# Patient Record
Sex: Female | Born: 1970
Health system: Southern US, Community
[De-identification: ages and names within clinical notes are randomized; demographics above are authoritative.]

## PROBLEM LIST (undated history)

## (undated) DIAGNOSIS — N939 Abnormal uterine and vaginal bleeding, unspecified: Secondary | ICD-10-CM

## (undated) DIAGNOSIS — M1990 Unspecified osteoarthritis, unspecified site: Secondary | ICD-10-CM

## (undated) DIAGNOSIS — N840 Polyp of corpus uteri: Secondary | ICD-10-CM

## (undated) DIAGNOSIS — K219 Gastro-esophageal reflux disease without esophagitis: Secondary | ICD-10-CM

## (undated) DIAGNOSIS — E785 Hyperlipidemia, unspecified: Secondary | ICD-10-CM

## (undated) DIAGNOSIS — F32A Depression, unspecified: Secondary | ICD-10-CM

## (undated) DIAGNOSIS — Z973 Presence of spectacles and contact lenses: Secondary | ICD-10-CM

## (undated) DIAGNOSIS — I10 Essential (primary) hypertension: Secondary | ICD-10-CM

## (undated) DIAGNOSIS — F419 Anxiety disorder, unspecified: Secondary | ICD-10-CM

## (undated) DIAGNOSIS — F329 Major depressive disorder, single episode, unspecified: Secondary | ICD-10-CM

## (undated) HISTORY — PX: CARPAL TUNNEL RELEASE: SHX101

## (undated) HISTORY — DX: Depression, unspecified: F32.A

## (undated) HISTORY — DX: Major depressive disorder, single episode, unspecified: F32.9

---

## 1998-03-13 ENCOUNTER — Other Ambulatory Visit: Admission: RE | Admit: 1998-03-13 | Discharge: 1998-03-13 | Payer: Self-pay | Admitting: *Deleted

## 1999-08-26 ENCOUNTER — Other Ambulatory Visit: Admission: RE | Admit: 1999-08-26 | Discharge: 1999-08-26 | Payer: Self-pay | Admitting: *Deleted

## 2000-09-10 ENCOUNTER — Other Ambulatory Visit: Admission: RE | Admit: 2000-09-10 | Discharge: 2000-09-10 | Payer: Self-pay | Admitting: *Deleted

## 2001-07-19 ENCOUNTER — Other Ambulatory Visit: Admission: RE | Admit: 2001-07-19 | Discharge: 2001-07-19 | Payer: Self-pay | Admitting: Obstetrics & Gynecology

## 2002-01-13 ENCOUNTER — Encounter: Admission: RE | Admit: 2002-01-13 | Discharge: 2002-01-13 | Payer: Self-pay | Admitting: Obstetrics and Gynecology

## 2002-03-07 ENCOUNTER — Inpatient Hospital Stay (HOSPITAL_COMMUNITY): Admission: AD | Admit: 2002-03-07 | Discharge: 2002-03-09 | Payer: Self-pay | Admitting: Obstetrics & Gynecology

## 2002-04-24 ENCOUNTER — Other Ambulatory Visit: Admission: RE | Admit: 2002-04-24 | Discharge: 2002-04-24 | Payer: Self-pay | Admitting: Obstetrics & Gynecology

## 2003-04-02 ENCOUNTER — Other Ambulatory Visit: Admission: RE | Admit: 2003-04-02 | Discharge: 2003-04-02 | Payer: Self-pay | Admitting: *Deleted

## 2004-02-18 HISTORY — PX: KNEE ARTHROSCOPY WITH ANTERIOR CRUCIATE LIGAMENT (ACL) REPAIR: SHX5644

## 2004-02-18 HISTORY — PX: KNEE ARTHROSCOPY W/ ACL RECONSTRUCTION: SHX1858

## 2004-04-14 ENCOUNTER — Other Ambulatory Visit: Admission: RE | Admit: 2004-04-14 | Discharge: 2004-04-14 | Payer: Self-pay | Admitting: *Deleted

## 2005-01-30 ENCOUNTER — Ambulatory Visit: Payer: Self-pay | Admitting: Family Medicine

## 2005-09-17 ENCOUNTER — Ambulatory Visit: Payer: Self-pay | Admitting: Family Medicine

## 2006-10-21 ENCOUNTER — Ambulatory Visit: Payer: Self-pay | Admitting: Family Medicine

## 2006-10-28 HISTORY — PX: CARPAL TUNNEL RELEASE: SHX101

## 2007-02-02 ENCOUNTER — Encounter: Payer: Self-pay | Admitting: Family Medicine

## 2007-02-07 ENCOUNTER — Ambulatory Visit: Payer: Self-pay | Admitting: Family Medicine

## 2007-02-14 LAB — CONVERTED CEMR LAB
Bilirubin, Direct: 0.1 mg/dL (ref 0.0–0.3)
Cholesterol: 214 mg/dL (ref 0–200)
Direct LDL: 144.2 mg/dL
GFR calc Af Amer: 104 mL/min
GFR calc non Af Amer: 86 mL/min
Glucose, Bld: 88 mg/dL (ref 70–99)
Potassium: 4 meq/L (ref 3.5–5.1)
Sodium: 140 meq/L (ref 135–145)
Total CHOL/HDL Ratio: 4.3
Triglycerides: 184 mg/dL — ABNORMAL HIGH (ref 0–149)

## 2007-08-02 ENCOUNTER — Other Ambulatory Visit: Admission: RE | Admit: 2007-08-02 | Discharge: 2007-08-02 | Payer: Self-pay | Admitting: Obstetrics & Gynecology

## 2007-08-09 ENCOUNTER — Ambulatory Visit: Payer: Self-pay | Admitting: Family Medicine

## 2007-08-19 LAB — CONVERTED CEMR LAB
ALT: 18 U/L
AST: 17 U/L
Albumin: 4.3 g/dL
Alkaline Phosphatase: 44 U/L
Bilirubin, Direct: 0.1 mg/dL
Cholesterol: 199 mg/dL
HDL: 51.7 mg/dL
LDL Cholesterol: 135 mg/dL — ABNORMAL HIGH
Total Bilirubin: 0.8 mg/dL
Total CHOL/HDL Ratio: 3.8
Total Protein: 7 g/dL
Triglycerides: 62 mg/dL
VLDL: 12 mg/dL

## 2008-04-16 ENCOUNTER — Ambulatory Visit: Payer: Self-pay | Admitting: Family Medicine

## 2008-04-16 DIAGNOSIS — F329 Major depressive disorder, single episode, unspecified: Secondary | ICD-10-CM

## 2008-08-07 ENCOUNTER — Other Ambulatory Visit: Admission: RE | Admit: 2008-08-07 | Discharge: 2008-08-07 | Payer: Self-pay | Admitting: Obstetrics and Gynecology

## 2010-01-13 ENCOUNTER — Ambulatory Visit: Payer: Self-pay | Admitting: Family Medicine

## 2010-07-29 NOTE — Assessment & Plan Note (Signed)
Summary: med refill/cbs   Vital Signs:  Patient profile:   40 year old female Height:      67 inches Weight:      189 pounds BMI:     29.71 Temp:     99.1 degrees F oral Pulse rate:   75 / minute BP sitting:   122 / 82  (left arm)  Vitals Entered By: Jeremy Johann CMA (January 13, 2010 3:16 PM) CC: med refill   History of Present Illness: Pt here for med refill.  Pt husband lost his job 3 months ago and her father is moving in with them so she feels like dose needs to be increased.   No other complaints.    Current Medications (verified): 1)  Zoloft 100 Mg Tabs (Sertraline Hcl) .Marland Kitchen.. 1 By Mouth Once Daily  Allergies (verified): No Known Drug Allergies  Past History:  Past medical, surgical, family and social histories (including risk factors) reviewed for relevance to current acute and chronic problems.  Past Medical History: Reviewed history from 02/07/2007 and no changes required. Unremarkable  Past Surgical History: Reviewed history from 02/07/2007 and no changes required. Carpal tunnel release 10/2006--Right---ortman  Family History: Reviewed history from 02/07/2007 and no changes required. Family History of CAD Female 1st degree relative 69----father Family History Diabetes 1st degree relative Family History High cholesterol Family History Hypertension Family History Lung cancer- nonsmoker  Social History: Reviewed history from 02/07/2007 and no changes required. Occupation: Actor Married Never Smoked Alcohol use-yes Drug use-no Regular exercise-yes  Review of Systems      See HPI  Physical Exam  General:  Well-developed,well-nourished,in no acute distress; alert,appropriate and cooperative throughout examination Psych:  Oriented X3, normally interactive, good eye contact, not anxious appearing, not depressed appearing, not agitated, not suicidal, and not homicidal.     Impression & Recommendations:  Problem # 1:  DEPRESSION, RECURRENT  (ICD-311)  Her updated medication list for this problem includes:    Zoloft 100 Mg Tabs (Sertraline hcl) .Marland Kitchen... 1 by mouth once daily  Complete Medication List: 1)  Zoloft 100 Mg Tabs (Sertraline hcl) .Marland Kitchen.. 1 by mouth once daily Prescriptions: ZOLOFT 100 MG TABS (SERTRALINE HCL) 1 by mouth once daily  #30 x 11   Entered and Authorized by:   Loreen Freud DO   Signed by:   Loreen Freud DO on 01/13/2010   Method used:   Faxed to ...       [id number] (retail)       [address]       [place], [place]  [address]       Ph: [phone]       Fax: [phone]   RxID:   [id number] ZOLOFT 50 MG TABS (SERTRALINE HCL) 1 by mouth once daily  #30 Tablet x 11   Entered and Authorized by:   Loreen Freud DO   Signed by:   Loreen Freud DO on 01/13/2010   Method used:   Faxed to ...       [id number] (retail)       [address]       [place], [place]  [address]       Ph: [phone]       Fax: [phone]   RxID:   [id number]

## 2011-01-20 ENCOUNTER — Other Ambulatory Visit: Payer: Self-pay | Admitting: Family Medicine

## 2011-01-20 NOTE — Telephone Encounter (Addendum)
One refill sent, pt is due for appt. Last seen 01/13/10.

## 2011-02-12 ENCOUNTER — Encounter: Payer: Self-pay | Admitting: Family Medicine

## 2011-02-17 ENCOUNTER — Encounter: Payer: Self-pay | Admitting: Family Medicine

## 2011-02-27 ENCOUNTER — Telehealth: Payer: Self-pay | Admitting: Family Medicine

## 2011-02-27 NOTE — Telephone Encounter (Signed)
Patient has cpx scheduled [id number] - lab scheduled [phone] need lab order - she needs labs for ins

## 2011-02-27 NOTE — Telephone Encounter (Signed)
V 70.0  Cbcd, bmp, hep, lipid, tsh, ua

## 2011-02-27 NOTE — Telephone Encounter (Signed)
Forward to [place] to schedule    KP

## 2011-02-27 NOTE — Telephone Encounter (Signed)
Please advise    Kp 

## 2011-03-05 ENCOUNTER — Other Ambulatory Visit: Payer: Self-pay | Admitting: Family Medicine

## 2011-03-05 DIAGNOSIS — Z0000 Encounter for general adult medical examination without abnormal findings: Secondary | ICD-10-CM

## 2011-03-06 ENCOUNTER — Other Ambulatory Visit (INDEPENDENT_AMBULATORY_CARE_PROVIDER_SITE_OTHER): Payer: BC Managed Care – PPO

## 2011-03-06 DIAGNOSIS — Z0000 Encounter for general adult medical examination without abnormal findings: Secondary | ICD-10-CM

## 2011-03-06 LAB — CBC WITH DIFFERENTIAL/PLATELET
Basophils Relative: 0.2 % (ref 0.0–3.0)
Eosinophils Relative: 1.5 % (ref 0.0–5.0)
Lymphocytes Relative: 34.6 % (ref 12.0–46.0)
MCV: 92.4 fl (ref 78.0–100.0)
Monocytes Absolute: 0.5 10*3/uL (ref 0.1–1.0)
Neutrophils Relative %: 55.9 % (ref 43.0–77.0)
Platelets: 309 10*3/uL (ref 150.0–400.0)
RBC: 4.47 Mil/uL (ref 3.87–5.11)
WBC: 6.4 10*3/uL (ref 4.5–10.5)

## 2011-03-06 LAB — BASIC METABOLIC PANEL
Chloride: 104 mEq/L (ref 96–112)
GFR: 90.54 mL/min (ref >60.00)
Glucose, Bld: 99 mg/dL (ref 70–99)
Potassium: 4.2 mEq/L (ref 3.5–5.1)
Sodium: 139 mEq/L (ref 135–145)

## 2011-03-06 LAB — TSH: TSH: 1.28 u[IU]/mL (ref 0.35–5.50)

## 2011-03-06 LAB — HEPATIC FUNCTION PANEL
ALT: 16 U/L (ref 0–35)
AST: 16 U/L (ref 0–37)
Albumin: 4.5 g/dL (ref 3.5–5.2)
Alkaline Phosphatase: 42 U/L (ref 39–117)
Total Bilirubin: 0.8 mg/dL (ref 0.3–1.2)

## 2011-03-06 LAB — LIPID PANEL
HDL: 65.1 mg/dL (ref >39.00)
VLDL: 12.8 mg/dL (ref 0.0–40.0)

## 2011-03-06 NOTE — Progress Notes (Signed)
Labs only

## 2011-03-10 NOTE — Progress Notes (Signed)
Labs only

## 2011-03-11 ENCOUNTER — Other Ambulatory Visit: Payer: Self-pay | Admitting: Family Medicine

## 2011-03-11 NOTE — Telephone Encounter (Signed)
Last seen 01/13/10 and filled 7/24/2 please advise--appt schedule for 04/16/11   KP

## 2011-03-23 ENCOUNTER — Encounter: Payer: Self-pay | Admitting: Family Medicine

## 2011-04-13 ENCOUNTER — Other Ambulatory Visit: Payer: Self-pay | Admitting: Family Medicine

## 2011-04-13 NOTE — Telephone Encounter (Signed)
Last Ov 12/2009. No existing appts. Ok to Rf?

## 2011-04-15 ENCOUNTER — Encounter: Payer: Self-pay | Admitting: Family Medicine

## 2011-04-16 ENCOUNTER — Encounter: Payer: Self-pay | Admitting: Family Medicine

## 2011-04-16 ENCOUNTER — Ambulatory Visit (INDEPENDENT_AMBULATORY_CARE_PROVIDER_SITE_OTHER): Payer: BC Managed Care – PPO | Admitting: Family Medicine

## 2011-04-16 VITALS — BP 100/80 | HR 76 | Temp 98.8°F | Resp 20 | Ht 67.0 in | Wt 191.0 lb

## 2011-04-16 DIAGNOSIS — Z0000 Encounter for general adult medical examination without abnormal findings: Secondary | ICD-10-CM

## 2011-04-16 DIAGNOSIS — Z136 Encounter for screening for cardiovascular disorders: Secondary | ICD-10-CM

## 2011-04-16 DIAGNOSIS — K219 Gastro-esophageal reflux disease without esophagitis: Secondary | ICD-10-CM

## 2011-04-16 DIAGNOSIS — R61 Generalized hyperhidrosis: Secondary | ICD-10-CM

## 2011-04-16 DIAGNOSIS — F329 Major depressive disorder, single episode, unspecified: Secondary | ICD-10-CM

## 2011-04-16 DIAGNOSIS — Z23 Encounter for immunization: Secondary | ICD-10-CM

## 2011-04-16 LAB — POCT URINALYSIS DIPSTICK
Bilirubin, UA: NEGATIVE
Blood, UA: NEGATIVE
Ketones, UA: NEGATIVE
Nitrite, UA: NEGATIVE
pH, UA: 5

## 2011-04-16 MED ORDER — OMEPRAZOLE MAGNESIUM 20 MG PO TBEC: 20.0000 mg | DELAYED_RELEASE_TABLET | Freq: Every day | ORAL | Status: DC

## 2011-04-16 MED ORDER — ALIGN 4 MG PO CAPS: 4.0000 mg | ORAL_CAPSULE | Freq: Once | ORAL | Status: DC

## 2011-04-16 MED ORDER — ALUMINUM CHLORIDE 20 % EX SOLN: Freq: Every day | CUTANEOUS | Status: DC

## 2011-04-16 MED ORDER — SERTRALINE HCL 100 MG PO TABS: 100.0000 mg | ORAL_TABLET | Freq: Every day | ORAL | Status: DC

## 2011-04-16 NOTE — Patient Instructions (Signed)

## 2011-04-16 NOTE — Progress Notes (Signed)
  Subjective:     Kari Lopez is a 40 y.o. female and is here for a comprehensive physical exam. The patient reports problems with relfux--no otc meds.  History   Social History  . Marital Status: Married    Spouse Name: Loraine Leriche    Number of Children: N/A  . Years of Education: N/A   Occupational History  .  [employer]   Social History Main Topics  . Smoking status: Never Smoker   . Smokeless tobacco: Not on file  . Alcohol Use: Yes     rarely  . Drug Use: No  . Sexually Active: Yes -- Female partner(s)   Other Topics Concern  . Not on file   Social History Narrative  . No narrative on file   Health Maintenance  Topic Date Due  . Tetanus/tdap  03/29/1989  . Influenza Vaccine  03/29/2012  . Pap Smear  10/14/2012    The following portions of the patient's history were reviewed and updated as appropriate: allergies, current medications, past family history, past medical history, past social history, past surgical history and problem list.  Review of Systems Review of Systems  Constitutional: Negative for activity change, appetite change and fatigue.  HENT: Negative for hearing loss, congestion, tinnitus and ear discharge.  dentist q30m Eyes: Negative for visual disturbance (see optho q1y -- vision corrected to 20/20 with glasses).  Respiratory: Negative for cough, chest tightness and shortness of breath.   Cardiovascular: Negative for chest pain, palpitations and leg swelling.  Gastrointestinal: Negative for abdominal pain, diarrhea, constipation and abdominal distention.  Genitourinary: Negative for urgency, frequency, decreased urine volume and difficulty urinating.  Musculoskeletal: Negative for back pain, arthralgias and gait problem.  Skin: Negative for color change, pallor and rash.  Neurological: Negative for dizziness, light-headedness, numbness and headaches.  Hematological: Negative for adenopathy. Does not bruise/bleed easily.  Psychiatric/Behavioral: Negative  for suicidal ideas, confusion, sleep disturbance, self-injury, dysphoric mood, decreased concentration and agitation.       Objective:    BP 100/80  Pulse 76  Temp(Src) 98.8 F (37.1 C) (Oral)  Resp 20  Wt 191 lb (86.637 kg)  SpO2 97% General appearance: alert, cooperative, appears stated age and no distress Head: Normocephalic, without obvious abnormality, atraumatic Eyes: conjunctivae/corneas clear. PERRL, EOM's intact. Fundi benign. Ears: normal TM's and external ear canals both ears Nose: Nares normal. Septum midline. Mucosa normal. No drainage or sinus tenderness. Throat: lips, mucosa, and tongue normal; teeth and gums normal Neck: no adenopathy, no carotid bruit, no JVD, supple, symmetrical, trachea midline and thyroid not enlarged, symmetric, no tenderness/mass/nodules Back: symmetric, no curvature. ROM normal. No CVA tenderness. Lungs: clear to auscultation bilaterally Breasts: normal appearance, no masses or tenderness Heart: regular rate and rhythm, S1, S2 normal, no murmur, click, rub or gallop Abdomen: soft, non-tender; bowel sounds normal; no masses,  no organomegaly Pelvic: gyn Extremities: extremities normal, atraumatic, no cyanosis or edema Pulses: 2+ and symmetric Skin: Skin color, texture, turgor normal. No rashes or lesions Lymph nodes: Cervical, supraclavicular, and axillary nodes normal. Neurologic: Alert and oriented X 3, normal strength and tone. Normal symmetric reflexes. Normal coordination and gait psych-- anxiety, depression    Assessment:    Healthy female exam.     depression-- con't meds  GERD--prilosec daily for 4-6 weeks Plan:  ghm utd Labs reviewed  Dtap given  See After Visit Summary for Counseling Recommendations

## 2011-10-16 ENCOUNTER — Ambulatory Visit (INDEPENDENT_AMBULATORY_CARE_PROVIDER_SITE_OTHER): Payer: BC Managed Care – PPO | Admitting: Family

## 2011-10-16 ENCOUNTER — Encounter: Payer: Self-pay | Admitting: Family

## 2011-10-16 VITALS — BP 122/74 | HR 64 | Temp 98.3°F | Resp 16 | Wt 189.1 lb

## 2011-10-16 DIAGNOSIS — L237 Allergic contact dermatitis due to plants, except food: Secondary | ICD-10-CM

## 2011-10-16 DIAGNOSIS — L255 Unspecified contact dermatitis due to plants, except food: Secondary | ICD-10-CM

## 2011-10-16 MED ORDER — METHYLPREDNISOLONE SODIUM SUCC 125 MG IJ SOLR: 125.0000 mg | Freq: Once | INTRAMUSCULAR | Status: DC

## 2011-10-16 MED ORDER — METHYLPREDNISOLONE 4 MG PO KIT: PACK | ORAL | Status: AC

## 2011-10-16 NOTE — Patient Instructions (Signed)
Poison Ivy Poison ivy is a inflammation of the skin (contact dermatitis) caused by touching the allergens on the leaves of the ivy plant following previous exposure to the plant. The rash usually appears 48 hours after exposure. The rash is usually bumps (papules) or blisters (vesicles) in a linear pattern. Depending on your own sensitivity, the rash may simply cause redness and itching, or it may also progress to blisters which may break open. These must be well cared for to prevent secondary bacterial (germ) infection, followed by scarring. Keep any open areas dry, clean, dressed, and covered with an antibacterial ointment if needed. The eyes may also get puffy. The puffiness is worst in the morning and gets better as the day progresses. This dermatitis usually heals without scarring, within 2 to 3 weeks without treatment. HOME CARE INSTRUCTIONS  Thoroughly wash with soap and water as soon as you have been exposed to poison ivy. You have about one half hour to remove the plant resin before it will cause the rash. This washing will destroy the oil or antigen on the skin that is causing, or will cause, the rash. Be sure to wash under your fingernails as any plant resin there will continue to spread the rash. Do not rub skin vigorously when washing affected area. Poison ivy cannot spread if no oil from the plant remains on your body. A rash that has progressed to weeping sores will not spread the rash unless you have not washed thoroughly. It is also important to wash any clothes you have been wearing as these may carry active allergens. The rash will return if you wear the unwashed clothing, even several days later. Avoidance of the plant in the future is the best measure. Poison ivy plant can be recognized by the number of leaves. Generally, poison ivy has three leaves with flowering branches on a single stem. Diphenhydramine may be purchased over the counter and used as needed for itching. Do not drive with  this medication if it makes you drowsy.Ask your caregiver about medication for children. SEEK MEDICAL CARE IF:  Open sores develop.   Redness spreads beyond area of rash.   You notice purulent (pus-like) discharge.   You have increased pain.   Other signs of infection develop (such as fever).  Document Released: 06/12/2000 Document Revised: 06/04/2011 Document Reviewed: 05/01/2009 [employer] Patient Information 2012 [employer]. 

## 2011-10-16 NOTE — Assessment & Plan Note (Signed)
Solumedrol here in the office today to be followed by a medrol dose pak.  Should be self limited, but pt is instructed to call if symptoms worsen, or if no improvement.

## 2011-10-16 NOTE — Progress Notes (Signed)
  Subjective:    Patient ID: Kari Lopez, female    DOB: 01/29/1971, 41 y.o.   MRN: [id number]  HPI  Kari Lopez is a 41 yr old female who presents today with chief complaint of rash.  Rash started 1 week ago after she dug a small hole in her yard.  She then developed a rash on her face and neck.  She has now started to develop some other smaller lesions on her legs.  Lesions are intensely pruritic.    Review of Systems See HPI  Past Medical History  Diagnosis Date  . Depression     History   Social History  . Marital Status: Married    Spouse Name: Loraine Leriche    Number of Children: N/A  . Years of Education: N/A   Occupational History  .  [employer]   Social History Main Topics  . Smoking status: Never Smoker   . Smokeless tobacco: Never Used  . Alcohol Use: Yes     rarely  . Drug Use: No  . Sexually Active: Yes -- Female partner(s)   Other Topics Concern  . Not on file   Social History Narrative  . No narrative on file    Past Surgical History  Procedure Date  . Carpal tunnel release     Family History  Problem Relation Age of Onset  . Coronary artery disease Other   . Diabetes Other   . Hyperlipidemia Other   . Hypertension Other   . Cancer Other     LUNG    Allergies  Allergen Reactions  . Codeine     Unknown reaction    Current Outpatient Prescriptions on File Prior to Visit  Medication Sig Dispense Refill  . aluminum chloride (DRYSOL) 20 % external solution Apply topically at bedtime.  35 mL  2  . omeprazole (PRILOSEC OTC) 20 MG tablet Take 1 tablet (20 mg total) by mouth daily.  28 tablet  1  . sertraline (ZOLOFT) 100 MG tablet Take 1 tablet (100 mg total) by mouth daily.  90 tablet  11  . Probiotic Product (ALIGN) 4 MG CAPS Take 4 mg by mouth once.        BP 122/74  Pulse 64  Temp(Src) 98.3 F (36.8 C) (Oral)  Resp 16  Wt 189 lb 1.3 oz (85.766 kg)  SpO2 99%       Objective:   Physical Exam  Constitutional: She appears  well-developed and well-nourished.  Skin:       Rash noted on right forehead and right cheek.  Some swelling noted beneath the right eye.  Rash extends behind the right ear on neck.  Small linear rash noted on left shin.  Multiple small red raised lesions noted on legs and arms.            Assessment & Plan:

## 2011-12-10 ENCOUNTER — Other Ambulatory Visit: Payer: Self-pay | Admitting: Family Medicine

## 2011-12-10 DIAGNOSIS — R61 Generalized hyperhidrosis: Secondary | ICD-10-CM

## 2011-12-10 MED ORDER — ALUMINUM CHLORIDE 20 % EX SOLN: Freq: Every day | CUTANEOUS | Status: DC

## 2011-12-10 NOTE — Telephone Encounter (Signed)
Patient needs refill for Drysol sent to [facility] [address] [place] - [address] Can call patient at [phone] [facility] states they can not find any history on this RX for this patient that is why she called here  Last ov 4.19.13 Last filled 10.18.12

## 2012-04-25 ENCOUNTER — Other Ambulatory Visit: Payer: Self-pay | Admitting: Family Medicine

## 2012-05-24 ENCOUNTER — Encounter: Payer: Self-pay | Admitting: Family Medicine

## 2012-05-24 ENCOUNTER — Ambulatory Visit (INDEPENDENT_AMBULATORY_CARE_PROVIDER_SITE_OTHER): Payer: BC Managed Care – PPO | Admitting: Family Medicine

## 2012-05-24 VITALS — BP 118/82 | HR 73 | Temp 98.5°F | Ht 68.0 in | Wt 198.6 lb

## 2012-05-24 DIAGNOSIS — R61 Generalized hyperhidrosis: Secondary | ICD-10-CM

## 2012-05-24 DIAGNOSIS — K219 Gastro-esophageal reflux disease without esophagitis: Secondary | ICD-10-CM

## 2012-05-24 DIAGNOSIS — F329 Major depressive disorder, single episode, unspecified: Secondary | ICD-10-CM

## 2012-05-24 DIAGNOSIS — Z13220 Encounter for screening for lipoid disorders: Secondary | ICD-10-CM

## 2012-05-24 DIAGNOSIS — Z0000 Encounter for general adult medical examination without abnormal findings: Secondary | ICD-10-CM

## 2012-05-24 LAB — T4, FREE: Free T4: 0.68 ng/dL (ref 0.60–1.60)

## 2012-05-24 LAB — POCT URINALYSIS DIPSTICK
Bilirubin, UA: NEGATIVE
Blood, UA: NEGATIVE
Nitrite, UA: NEGATIVE
Protein, UA: NEGATIVE
Urobilinogen, UA: 0.2
pH, UA: 7.5

## 2012-05-24 LAB — CBC WITH DIFFERENTIAL/PLATELET
Basophils Absolute: 0 10*3/uL (ref 0.0–0.1)
Eosinophils Absolute: 0.2 10*3/uL (ref 0.0–0.7)
HCT: 43.3 % (ref 36.0–46.0)
Lymphs Abs: 2.6 10*3/uL (ref 0.7–4.0)
MCHC: 33.3 g/dL (ref 30.0–36.0)
Monocytes Absolute: 0.6 10*3/uL (ref 0.1–1.0)
Monocytes Relative: 8.8 % (ref 3.0–12.0)
Platelets: 334 10*3/uL (ref 150.0–400.0)
RDW: 12.4 % (ref 11.5–14.6)

## 2012-05-24 LAB — BASIC METABOLIC PANEL
BUN: 19 mg/dL (ref 6–23)
CO2: 27 mEq/L (ref 19–32)
GFR: 99.09 mL/min (ref >60.00)
Glucose, Bld: 87 mg/dL (ref 70–99)
Potassium: 3.8 mEq/L (ref 3.5–5.1)

## 2012-05-24 LAB — HEPATIC FUNCTION PANEL
AST: 27 U/L (ref 0–37)
Total Bilirubin: 0.8 mg/dL (ref 0.3–1.2)

## 2012-05-24 LAB — LIPID PANEL
Cholesterol: 218 mg/dL — ABNORMAL HIGH (ref 0–200)
HDL: 60 mg/dL (ref >39.00)
Total CHOL/HDL Ratio: 4
Triglycerides: 193 mg/dL — ABNORMAL HIGH (ref 0.0–149.0)
VLDL: 38.6 mg/dL (ref 0.0–40.0)

## 2012-05-24 MED ORDER — OMEPRAZOLE MAGNESIUM 20 MG PO TBEC: 20.0000 mg | DELAYED_RELEASE_TABLET | Freq: Every day | ORAL | Status: DC

## 2012-05-24 MED ORDER — ALUMINUM CHLORIDE 20 % EX SOLN: Freq: Every day | CUTANEOUS | Status: DC

## 2012-05-24 MED ORDER — SERTRALINE HCL 100 MG PO TABS: ORAL_TABLET | ORAL | Status: DC

## 2012-05-24 NOTE — Patient Instructions (Signed)
Preventive Care for Adults, Female A healthy lifestyle and preventive care can promote health and wellness. Preventive health guidelines for women include the following key practices.  A routine yearly physical is a good way to check with your caregiver about your health and preventive screening. It is a chance to share any concerns and updates on your health, and to receive a thorough exam.  Visit your dentist for a routine exam and preventive care every 6 months. Brush your teeth twice a day and floss once a day. Good oral hygiene prevents tooth decay and gum disease.  The frequency of eye exams is based on your age, health, family medical history, use of contact lenses, and other factors. Follow your caregiver's recommendations for frequency of eye exams.  Eat a healthy diet. Foods like vegetables, fruits, whole grains, low-fat dairy products, and lean protein foods contain the nutrients you need without too many calories. Decrease your intake of foods high in solid fats, added sugars, and salt. Eat the right amount of calories for you.Get information about a proper diet from your caregiver, if necessary.  Regular physical exercise is one of the most important things you can do for your health. Most adults should get at least 150 minutes of moderate-intensity exercise (any activity that increases your heart rate and causes you to sweat) each week. In addition, most adults need muscle-strengthening exercises on 2 or more days a week.  Maintain a healthy weight. The body mass index (BMI) is a screening tool to identify possible weight problems. It provides an estimate of body fat based on height and weight. Your caregiver can help determine your BMI, and can help you achieve or maintain a healthy weight.For adults 20 years and older:  A BMI below 18.5 is considered underweight.  A BMI of 18.5 to 24.9 is normal.  A BMI of 25 to 29.9 is considered overweight.  A BMI of 30 and above is  considered obese.  Maintain normal blood lipids and cholesterol levels by exercising and minimizing your intake of saturated fat. Eat a balanced diet with plenty of fruit and vegetables. Blood tests for lipids and cholesterol should begin at age 20 and be repeated every 5 years. If your lipid or cholesterol levels are high, you are over 50, or you are at high risk for heart disease, you may need your cholesterol levels checked more frequently.Ongoing high lipid and cholesterol levels should be treated with medicines if diet and exercise are not effective.  If you smoke, find out from your caregiver how to quit. If you do not use tobacco, do not start.  If you are pregnant, do not drink alcohol. If you are breastfeeding, be very cautious about drinking alcohol. If you are not pregnant and choose to drink alcohol, do not exceed 1 drink per day. One drink is considered to be 12 ounces (355 mL) of beer, 5 ounces (148 mL) of wine, or 1.5 ounces (44 mL) of liquor.  Avoid use of street drugs. Do not share needles with anyone. Ask for help if you need support or instructions about stopping the use of drugs.  High blood pressure causes heart disease and increases the risk of stroke. Your blood pressure should be checked at least every 1 to 2 years. Ongoing high blood pressure should be treated with medicines if weight loss and exercise are not effective.  If you are 55 to 42 years old, ask your caregiver if you should take aspirin to prevent strokes.  Diabetes   screening involves taking a blood sample to check your fasting blood sugar level. This should be done once every 3 years, after age 45, if you are within normal weight and without risk factors for diabetes. Testing should be considered at a younger age or be carried out more frequently if you are overweight and have at least 1 risk factor for diabetes.  Breast cancer screening is essential preventive care for women. You should practice "breast  self-awareness." This means understanding the normal appearance and feel of your breasts and may include breast self-examination. Any changes detected, no matter how small, should be reported to a caregiver. Women in their 20s and 30s should have a clinical breast exam (CBE) by a caregiver as part of a regular health exam every 1 to 3 years. After age 40, women should have a CBE every year. Starting at age 40, women should consider having a mammography (breast X-ray test) every year. Women who have a family history of breast cancer should talk to their caregiver about genetic screening. Women at a high risk of breast cancer should talk to their caregivers about having magnetic resonance imaging (MRI) and a mammography every year.  The Pap test is a screening test for cervical cancer. A Pap test can show cell changes on the cervix that might become cervical cancer if left untreated. A Pap test is a procedure in which cells are obtained and examined from the lower end of the uterus (cervix).  Women should have a Pap test starting at age 21.  Between ages 21 and 29, Pap tests should be repeated every 2 years.  Beginning at age 30, you should have a Pap test every 3 years as long as the past 3 Pap tests have been normal.  Some women have medical problems that increase the chance of getting cervical cancer. Talk to your caregiver about these problems. It is especially important to talk to your caregiver if a new problem develops soon after your last Pap test. In these cases, your caregiver may recommend more frequent screening and Pap tests.  The above recommendations are the same for women who have or have not gotten the vaccine for human papillomavirus (HPV).  If you had a hysterectomy for a problem that was not cancer or a condition that could lead to cancer, then you no longer need Pap tests. Even if you no longer need a Pap test, a regular exam is a good idea to make sure no other problems are  starting.  If you are between ages 65 and 70, and you have had normal Pap tests going back 10 years, you no longer need Pap tests. Even if you no longer need a Pap test, a regular exam is a good idea to make sure no other problems are starting.  If you have had past treatment for cervical cancer or a condition that could lead to cancer, you need Pap tests and screening for cancer for at least 20 years after your treatment.  If Pap tests have been discontinued, risk factors (such as a new sexual partner) need to be reassessed to determine if screening should be resumed.  The HPV test is an additional test that may be used for cervical cancer screening. The HPV test looks for the virus that can cause the cell changes on the cervix. The cells collected during the Pap test can be tested for HPV. The HPV test could be used to screen women aged 30 years and older, and should   be used in women of any age who have unclear Pap test results. After the age of 30, women should have HPV testing at the same frequency as a Pap test.  Colorectal cancer can be detected and often prevented. Most routine colorectal cancer screening begins at the age of 50 and continues through age 75. However, your caregiver may recommend screening at an earlier age if you have risk factors for colon cancer. On a yearly basis, your caregiver may provide home test kits to check for hidden blood in the stool. Use of a small camera at the end of a tube, to directly examine the colon (sigmoidoscopy or colonoscopy), can detect the earliest forms of colorectal cancer. Talk to your caregiver about this at age 50, when routine screening begins. Direct examination of the colon should be repeated every 5 to 10 years through age 75, unless early forms of pre-cancerous polyps or small growths are found.  Hepatitis C blood testing is recommended for all people born from 1945 through 1965 and any individual with known risks for hepatitis C.  Practice  safe sex. Use condoms and avoid high-risk sexual practices to reduce the spread of sexually transmitted infections (STIs). STIs include gonorrhea, chlamydia, syphilis, trichomonas, herpes, HPV, and human immunodeficiency virus (HIV). Herpes, HIV, and HPV are viral illnesses that have no cure. They can result in disability, cancer, and death. Sexually active women aged 25 and younger should be checked for chlamydia. Older women with new or multiple partners should also be tested for chlamydia. Testing for other STIs is recommended if you are sexually active and at increased risk.  Osteoporosis is a disease in which the bones lose minerals and strength with aging. This can result in serious bone fractures. The risk of osteoporosis can be identified using a bone density scan. Women ages 65 and over and women at risk for fractures or osteoporosis should discuss screening with their caregivers. Ask your caregiver whether you should take a calcium supplement or vitamin D to reduce the rate of osteoporosis.  Menopause can be associated with physical symptoms and risks. Hormone replacement therapy is available to decrease symptoms and risks. You should talk to your caregiver about whether hormone replacement therapy is right for you.  Use sunscreen with sun protection factor (SPF) of 30 or more. Apply sunscreen liberally and repeatedly throughout the day. You should seek shade when your shadow is shorter than you. Protect yourself by wearing long sleeves, pants, a wide-brimmed hat, and sunglasses year round, whenever you are outdoors.  Once a month, do a whole body skin exam, using a mirror to look at the skin on your back. Notify your caregiver of new moles, moles that have irregular borders, moles that are larger than a pencil eraser, or moles that have changed in shape or color.  Stay current with required immunizations.  Influenza. You need a dose every fall (or winter). The composition of the flu vaccine  changes each year, so being vaccinated once is not enough.  Pneumococcal polysaccharide. You need 1 to 2 doses if you smoke cigarettes or if you have certain chronic medical conditions. You need 1 dose at age 65 (or older) if you have never been vaccinated.  Tetanus, diphtheria, pertussis (Tdap, Td). Get 1 dose of Tdap vaccine if you are younger than age 65, are over 65 and have contact with an infant, are a healthcare worker, are pregnant, or simply want to be protected from whooping cough. After that, you need a Td   booster dose every 10 years. Consult your caregiver if you have not had at least 3 tetanus and diphtheria-containing shots sometime in your life or have a deep or dirty wound.  HPV. You need this vaccine if you are a woman age 26 or younger. The vaccine is given in 3 doses over 6 months.  Measles, mumps, rubella (MMR). You need at least 1 dose of MMR if you were born in 1957 or later. You may also need a second dose.  Meningococcal. If you are age 19 to 21 and a first-year college student living in a residence hall, or have one of several medical conditions, you need to get vaccinated against meningococcal disease. You may also need additional booster doses.  Zoster (shingles). If you are age 60 or older, you should get this vaccine.  Varicella (chickenpox). If you have never had chickenpox or you were vaccinated but received only 1 dose, talk to your caregiver to find out if you need this vaccine.  Hepatitis A. You need this vaccine if you have a specific risk factor for hepatitis A virus infection or you simply wish to be protected from this disease. The vaccine is usually given as 2 doses, 6 to 18 months apart.  Hepatitis B. You need this vaccine if you have a specific risk factor for hepatitis B virus infection or you simply wish to be protected from this disease. The vaccine is given in 3 doses, usually over 6 months. Preventive Services / Frequency Ages 19 to 39  Blood  pressure check.** / Every 1 to 2 years.  Lipid and cholesterol check.** / Every 5 years beginning at age 20.  Clinical breast exam.** / Every 3 years for women in their 20s and 30s.  Pap test.** / Every 2 years from ages 21 through 29. Every 3 years starting at age 30 through age 65 or 70 with a history of 3 consecutive normal Pap tests.  HPV screening.** / Every 3 years from ages 30 through ages 65 to 70 with a history of 3 consecutive normal Pap tests.  Hepatitis C blood test.** / For any individual with known risks for hepatitis C.  Skin self-exam. / Monthly.  Influenza immunization.** / Every year.  Pneumococcal polysaccharide immunization.** / 1 to 2 doses if you smoke cigarettes or if you have certain chronic medical conditions.  Tetanus, diphtheria, pertussis (Tdap, Td) immunization. / A one-time dose of Tdap vaccine. After that, you need a Td booster dose every 10 years.  HPV immunization. / 3 doses over 6 months, if you are 26 and younger.  Measles, mumps, rubella (MMR) immunization. / You need at least 1 dose of MMR if you were born in 1957 or later. You may also need a second dose.  Meningococcal immunization. / 1 dose if you are age 19 to 21 and a first-year college student living in a residence hall, or have one of several medical conditions, you need to get vaccinated against meningococcal disease. You may also need additional booster doses.  Varicella immunization.** / Consult your caregiver.  Hepatitis A immunization.** / Consult your caregiver. 2 doses, 6 to 18 months apart.  Hepatitis B immunization.** / Consult your caregiver. 3 doses usually over 6 months. Ages 40 to 64  Blood pressure check.** / Every 1 to 2 years.  Lipid and cholesterol check.** / Every 5 years beginning at age 20.  Clinical breast exam.** / Every year after age 40.  Mammogram.** / Every year beginning at age 40   and continuing for as long as you are in good health. Consult with your  caregiver.  Pap test.** / Every 3 years starting at age 30 through age 65 or 70 with a history of 3 consecutive normal Pap tests.  HPV screening.** / Every 3 years from ages 30 through ages 65 to 70 with a history of 3 consecutive normal Pap tests.  Fecal occult blood test (FOBT) of stool. / Every year beginning at age 50 and continuing until age 75. You may not need to do this test if you get a colonoscopy every 10 years.  Flexible sigmoidoscopy or colonoscopy.** / Every 5 years for a flexible sigmoidoscopy or every 10 years for a colonoscopy beginning at age 50 and continuing until age 75.  Hepatitis C blood test.** / For all people born from 1945 through 1965 and any individual with known risks for hepatitis C.  Skin self-exam. / Monthly.  Influenza immunization.** / Every year.  Pneumococcal polysaccharide immunization.** / 1 to 2 doses if you smoke cigarettes or if you have certain chronic medical conditions.  Tetanus, diphtheria, pertussis (Tdap, Td) immunization.** / A one-time dose of Tdap vaccine. After that, you need a Td booster dose every 10 years.  Measles, mumps, rubella (MMR) immunization. / You need at least 1 dose of MMR if you were born in 1957 or later. You may also need a second dose.  Varicella immunization.** / Consult your caregiver.  Meningococcal immunization.** / Consult your caregiver.  Hepatitis A immunization.** / Consult your caregiver. 2 doses, 6 to 18 months apart.  Hepatitis B immunization.** / Consult your caregiver. 3 doses, usually over 6 months. Ages 65 and over  Blood pressure check.** / Every 1 to 2 years.  Lipid and cholesterol check.** / Every 5 years beginning at age 20.  Clinical breast exam.** / Every year after age 40.  Mammogram.** / Every year beginning at age 40 and continuing for as long as you are in good health. Consult with your caregiver.  Pap test.** / Every 3 years starting at age 30 through age 65 or 70 with a 3  consecutive normal Pap tests. Testing can be stopped between 65 and 70 with 3 consecutive normal Pap tests and no abnormal Pap or HPV tests in the past 10 years.  HPV screening.** / Every 3 years from ages 30 through ages 65 or 70 with a history of 3 consecutive normal Pap tests. Testing can be stopped between 65 and 70 with 3 consecutive normal Pap tests and no abnormal Pap or HPV tests in the past 10 years.  Fecal occult blood test (FOBT) of stool. / Every year beginning at age 50 and continuing until age 75. You may not need to do this test if you get a colonoscopy every 10 years.  Flexible sigmoidoscopy or colonoscopy.** / Every 5 years for a flexible sigmoidoscopy or every 10 years for a colonoscopy beginning at age 50 and continuing until age 75.  Hepatitis C blood test.** / For all people born from 1945 through 1965 and any individual with known risks for hepatitis C.  Osteoporosis screening.** / A one-time screening for women ages 65 and over and women at risk for fractures or osteoporosis.  Skin self-exam. / Monthly.  Influenza immunization.** / Every year.  Pneumococcal polysaccharide immunization.** / 1 dose at age 65 (or older) if you have never been vaccinated.  Tetanus, diphtheria, pertussis (Tdap, Td) immunization. / A one-time dose of Tdap vaccine if you are over   65 and have contact with an infant, are a healthcare worker, or simply want to be protected from whooping cough. After that, you need a Td booster dose every 10 years.  Varicella immunization.** / Consult your caregiver.  Meningococcal immunization.** / Consult your caregiver.  Hepatitis A immunization.** / Consult your caregiver. 2 doses, 6 to 18 months apart.  Hepatitis B immunization.** / Check with your caregiver. 3 doses, usually over 6 months. ** Family history and personal history of risk and conditions may change your caregiver's recommendations. Document Released: 08/11/2001 Document Revised: 09/07/2011  Document Reviewed: 11/10/2010 [employer] Patient Information 2013 [employer].  

## 2012-05-24 NOTE — Progress Notes (Signed)
Subjective:     Kari Lopez is a 42 y.o. female and is here for a comprehensive physical exam. The patient reports no problems.  History   Social History  . Marital Status: Married    Spouse Name: Loraine Leriche    Number of Children: N/A  . Years of Education: N/A   Occupational History  .  [employer]   Social History Main Topics  . Smoking status: Never Smoker   . Smokeless tobacco: Never Used  . Alcohol Use: Yes     Comment: rarely  . Drug Use: No  . Sexually Active: Yes -- Female partner(s)   Other Topics Concern  . Not on file   Social History Narrative   Exercise--- gym ,  3x a week   Health Maintenance  Topic Date Due  . Influenza Vaccine  02/28/2012  . Pap Smear  10/14/2012  . Tetanus/tdap  04/15/2021    The following portions of the patient's history were reviewed and updated as appropriate:  She  has a past medical history of Depression. She  does not have any pertinent problems on file. She  has past surgical history that includes Carpal tunnel release. Her family history includes Cancer in her other; Coronary artery disease in her other; Diabetes in her other; Hyperlipidemia in her other; and Hypertension in her other. She  reports that she has never smoked. She has never used smokeless tobacco. She reports that she drinks alcohol. She reports that she does not use illicit drugs. She has a current medication list which includes the following prescription(s): aluminum chloride, omeprazole, and sertraline, and the following Facility-Administered Medications: methylprednisolone sodium succinate. Current Outpatient Prescriptions on File Prior to Visit  Medication Sig Dispense Refill  . [DISCONTINUED] omeprazole (PRILOSEC OTC) 20 MG tablet Take 1 tablet (20 mg total) by mouth daily.  28 tablet  1  . [DISCONTINUED] sertraline (ZOLOFT) 100 MG tablet 1 Tab by mouth daily--Office visit due now  30 tablet  0   Current Facility-Administered Medications on File Prior to  Visit  Medication Dose Route Frequency Provider Last Rate Last Dose  . methylPREDNISolone sodium succinate (SOLU-MEDROL) 125 mg/2 mL injection 125 mg  125 mg Intramuscular Once Sandford Craze, NP       She is allergic to codeine..  Review of Systems Review of Systems  Constitutional: Negative for activity change, appetite change and fatigue.  HENT: Negative for hearing loss, congestion, tinnitus and ear discharge.  dentist q47m Eyes: Negative for visual disturbance (see optho q2y -- vision corrected to 20/20 with glasses).  Respiratory: Negative for cough, chest tightness and shortness of breath.   Cardiovascular: Negative for chest pain, palpitations and leg swelling.  Gastrointestinal: Negative for abdominal pain, diarrhea, constipation and abdominal distention.  Genitourinary: Negative for urgency, frequency, decreased urine volume and difficulty urinating.  Musculoskeletal: Negative for back pain, arthralgias and gait problem.  Skin: Negative for color change, pallor and rash.  Neurological: Negative for dizziness, light-headedness, numbness and headaches.  Hematological: Negative for adenopathy. Does not bruise/bleed easily.  Psychiatric/Behavioral: Negative for suicidal ideas, confusion, sleep disturbance, self-injury, dysphoric mood, decreased concentration and agitation.       Objective:    BP 118/82  Pulse 73  Temp 98.5 F (36.9 C) (Oral)  Ht 5\' 8"  (1.727 m)  Wt 198 lb 9.6 oz (90.084 kg)  BMI 30.20 kg/m2  SpO2 98% General appearance: alert, cooperative, appears stated age and no distress Head: Normocephalic, without obvious abnormality, atraumatic Eyes: conjunctivae/corneas clear.  PERRL, EOM's intact. Fundi benign. Ears: normal TM's and external ear canals both ears Nose: Nares normal. Septum midline. Mucosa normal. No drainage or sinus tenderness. Throat: lips, mucosa, and tongue normal; teeth and gums normal Neck: no adenopathy, supple, symmetrical, trachea  midline and thyroid not enlarged, symmetric, no tenderness/mass/nodules Back: symmetric, no curvature. ROM normal. No CVA tenderness. Lungs: clear to auscultation bilaterally Breasts: normal appearance, no masses or tenderness Heart: regular rate and rhythm, S1, S2 normal, no murmur, click, rub or gallop Abdomen: soft, non-tender; bowel sounds normal; no masses,  no organomegaly Pelvic: deferred---pt preference Extremities: extremities normal, atraumatic, no cyanosis or edema Pulses: 2+ and symmetric Skin: Skin color, texture, turgor normal. No rashes or lesions Lymph nodes: Cervical, supraclavicular, and axillary nodes normal. Neurologic: Alert and oriented X 3, normal strength and tone. Normal symmetric reflexes. Normal coordination and gait psych-- stable   Assessment:    Healthy female exam.      Plan:    ghm utd Check labs See After Visit Summary for Counseling Recommendations  Pt did not have gyn exam / mammogram because of a $12000 deductible-- d/w pt $99 mammogram at [facility] will think about it

## 2012-05-24 NOTE — Assessment & Plan Note (Signed)
con't meds 

## 2012-05-30 ENCOUNTER — Telehealth: Payer: Self-pay

## 2012-05-30 DIAGNOSIS — R61 Generalized hyperhidrosis: Secondary | ICD-10-CM

## 2012-05-30 DIAGNOSIS — K219 Gastro-esophageal reflux disease without esophagitis: Secondary | ICD-10-CM

## 2012-05-30 MED ORDER — ALUMINUM CHLORIDE 20 % EX SOLN: Freq: Every day | CUTANEOUS | Status: AC

## 2012-05-30 MED ORDER — OMEPRAZOLE MAGNESIUM 20 MG PO TBEC: 20.0000 mg | DELAYED_RELEASE_TABLET | Freq: Every day | ORAL | Status: DC

## 2012-05-30 MED ORDER — SERTRALINE HCL 100 MG PO TABS: ORAL_TABLET | ORAL | Status: DC

## 2012-05-30 NOTE — Telephone Encounter (Signed)
Call from patient requesting to have her prescriptions sent to [facility] on [address].     KP

## 2013-01-19 ENCOUNTER — Other Ambulatory Visit: Payer: Self-pay | Admitting: Family Medicine

## 2013-01-19 ENCOUNTER — Telehealth: Payer: Self-pay | Admitting: Family Medicine

## 2013-01-19 NOTE — Telephone Encounter (Signed)
Patient is asking for a refill on her Zoloft medication. She has been out for 2 days. Made an appointment for 02/06/13. Patient uses [facility] on [address].

## 2013-01-21 ENCOUNTER — Other Ambulatory Visit: Payer: Self-pay | Admitting: Family Medicine

## 2013-01-23 NOTE — Telephone Encounter (Signed)
Patient is calling back about her prescription. States that she has been out for 5 days.

## 2013-01-27 ENCOUNTER — Other Ambulatory Visit: Payer: Self-pay | Admitting: *Deleted

## 2013-01-27 DIAGNOSIS — F329 Major depressive disorder, single episode, unspecified: Secondary | ICD-10-CM

## 2013-01-27 MED ORDER — SERTRALINE HCL 100 MG PO TABS: ORAL_TABLET | ORAL | Status: DC

## 2013-01-27 NOTE — Telephone Encounter (Signed)
Refill done.  

## 2013-01-27 NOTE — Telephone Encounter (Signed)
RF for zoloft sent to [facility]

## 2013-02-06 ENCOUNTER — Ambulatory Visit (INDEPENDENT_AMBULATORY_CARE_PROVIDER_SITE_OTHER): Payer: BC Managed Care – PPO | Admitting: Family Medicine

## 2013-02-06 ENCOUNTER — Encounter: Payer: Self-pay | Admitting: Family Medicine

## 2013-02-06 VITALS — BP 120/84 | HR 72 | Temp 99.1°F | Wt 200.6 lb

## 2013-02-06 DIAGNOSIS — E785 Hyperlipidemia, unspecified: Secondary | ICD-10-CM

## 2013-02-06 DIAGNOSIS — K219 Gastro-esophageal reflux disease without esophagitis: Secondary | ICD-10-CM

## 2013-02-06 DIAGNOSIS — F329 Major depressive disorder, single episode, unspecified: Secondary | ICD-10-CM

## 2013-02-06 LAB — LIPID PANEL
Cholesterol: 266 mg/dL — ABNORMAL HIGH (ref 0–200)
HDL: 59.5 mg/dL (ref >39.00)
Total CHOL/HDL Ratio: 4
Triglycerides: 203 mg/dL — ABNORMAL HIGH (ref 0.0–149.0)
VLDL: 40.6 mg/dL — ABNORMAL HIGH (ref 0.0–40.0)

## 2013-02-06 LAB — HEPATIC FUNCTION PANEL
ALT: 28 U/L (ref 0–35)
Bilirubin, Direct: 0 mg/dL (ref 0.0–0.3)
Total Bilirubin: 0.6 mg/dL (ref 0.3–1.2)

## 2013-02-06 LAB — BASIC METABOLIC PANEL
Chloride: 104 mEq/L (ref 96–112)
Creatinine, Ser: 0.6 mg/dL (ref 0.4–1.2)
GFR: 107.72 mL/min (ref >60.00)

## 2013-02-06 LAB — LDL CHOLESTEROL, DIRECT: Direct LDL: 184 mg/dL

## 2013-02-06 MED ORDER — OMEPRAZOLE 20 MG PO CPDR: 20.0000 mg | DELAYED_RELEASE_CAPSULE | Freq: Every day | ORAL | Status: DC

## 2013-02-06 MED ORDER — SERTRALINE HCL 100 MG PO TABS: ORAL_TABLET | ORAL | Status: DC

## 2013-02-06 NOTE — Patient Instructions (Signed)

## 2013-02-06 NOTE — Assessment & Plan Note (Signed)
Stable Cont meds 

## 2013-02-06 NOTE — Assessment & Plan Note (Signed)
Check labs 

## 2013-02-06 NOTE — Progress Notes (Signed)
  Subjective:    Patient ID: Kari Lopez, female    DOB: Jun 27, 1971, 42 y.o.   MRN: [id number]  HPI Pt here f/u cholesterol and refill zoloft.  No complaints.   Review of Systems Review of Systems  Constitutional: Negative for activity change, appetite change and fatigue.  HENT: Negative for hearing loss, congestion, tinnitus and ear discharge.  dentist q42m Eyes: Negative for visual disturbance (see optho q1y -- vision corrected to 20/20 with glasses).  Respiratory: Negative for cough, chest tightness and shortness of breath.   Cardiovascular: Negative for chest pain, palpitations and leg swelling.  Gastrointestinal: Negative for abdominal pain, diarrhea, constipation and abdominal distention.  Genitourinary: Negative for urgency, frequency, decreased urine volume and difficulty urinating.  Musculoskeletal: Negative for back pain, arthralgias and gait problem.  Skin: Negative for color change, pallor and rash.  Neurological: Negative for dizziness, light-headedness, numbness and headaches.  Hematological: Negative for adenopathy. Does not bruise/bleed easily.  Psychiatric/Behavioral: Negative for suicidal ideas, confusion, sleep disturbance, self-injury, dysphoric mood, decreased concentration and agitation.    Past Medical History  Diagnosis Date  . Depression    History   Social History  . Marital Status: Married    Spouse Name: Loraine Leriche    Number of Children: N/A  . Years of Education: N/A   Occupational History  .  [employer]   Social History Main Topics  . Smoking status: Never Smoker   . Smokeless tobacco: Never Used  . Alcohol Use: Yes     Comment: rarely  . Drug Use: No  . Sexually Active: Yes -- Female partner(s)   Other Topics Concern  . Not on file   Social History Narrative   Exercise--- gym ,  3x a week   Family History  Problem Relation Age of Onset  . Coronary artery disease Other   . Diabetes Other   . Hyperlipidemia Other   . Hypertension  Other   . Cancer Other     LUNG   Current Outpatient Prescriptions on File Prior to Visit  Medication Sig Dispense Refill  . aluminum chloride (DRYSOL) 20 % external solution Apply topically at bedtime.  35 mL  2   No current facility-administered medications on file prior to visit.        Objective:   Physical Exam  BP 120/84  Pulse 72  Temp(Src) 99.1 F (37.3 C) (Oral)  Wt 200 lb 9.6 oz (90.992 kg)  BMI 30.51 kg/m2  SpO2 97% General appearance: alert, cooperative, appears stated age and no distress Neck: no adenopathy, supple, symmetrical, trachea midline and thyroid not enlarged, symmetric, no tenderness/mass/nodules Lungs: clear to auscultation bilaterally Heart: S1, S2 normal Extremities: extremities normal, atraumatic, no cyanosis or edema       Assessment & Plan:

## 2013-02-08 MED ORDER — ATORVASTATIN CALCIUM 10 MG PO TABS: 10.0000 mg | ORAL_TABLET | Freq: Every day | ORAL | Status: DC

## 2013-05-04 ENCOUNTER — Other Ambulatory Visit: Payer: Self-pay

## 2013-06-08 ENCOUNTER — Encounter: Payer: BC Managed Care – PPO | Admitting: Family Medicine

## 2013-06-26 ENCOUNTER — Other Ambulatory Visit: Payer: Self-pay | Admitting: Family Medicine

## 2013-06-26 NOTE — Telephone Encounter (Signed)
Previously taking Drysol for excessive sweating. Please advise      KP

## 2013-07-07 LAB — HM PAP SMEAR

## 2013-08-31 ENCOUNTER — Telehealth: Payer: Self-pay

## 2013-08-31 NOTE — Telephone Encounter (Signed)
Left message for call back Non-identifiable   Pap- 08/07/08- negative Flu- Td- 04/16/11

## 2013-09-04 ENCOUNTER — Ambulatory Visit (INDEPENDENT_AMBULATORY_CARE_PROVIDER_SITE_OTHER): Payer: BC Managed Care – PPO | Admitting: Family Medicine

## 2013-09-04 ENCOUNTER — Encounter: Payer: Self-pay | Admitting: Family Medicine

## 2013-09-04 VITALS — BP 120/80 | HR 66 | Temp 98.4°F | Ht 68.0 in | Wt 197.0 lb

## 2013-09-04 DIAGNOSIS — Z309 Encounter for contraceptive management, unspecified: Secondary | ICD-10-CM

## 2013-09-04 DIAGNOSIS — Z0000 Encounter for general adult medical examination without abnormal findings: Secondary | ICD-10-CM

## 2013-09-04 DIAGNOSIS — IMO0001 Reserved for inherently not codable concepts without codable children: Secondary | ICD-10-CM

## 2013-09-04 DIAGNOSIS — E785 Hyperlipidemia, unspecified: Secondary | ICD-10-CM

## 2013-09-04 DIAGNOSIS — Z1239 Encounter for other screening for malignant neoplasm of breast: Secondary | ICD-10-CM

## 2013-09-04 LAB — BASIC METABOLIC PANEL
BUN: 22 mg/dL (ref 6–23)
CALCIUM: 8.9 mg/dL (ref 8.4–10.5)
CO2: 24 mEq/L (ref 19–32)
CREATININE: 0.6 mg/dL (ref 0.4–1.2)
Chloride: 107 mEq/L (ref 96–112)
GFR: 111.43 mL/min (ref >60.00)
Glucose, Bld: 96 mg/dL (ref 70–99)
Potassium: 3.8 mEq/L (ref 3.5–5.1)
SODIUM: 139 meq/L (ref 135–145)

## 2013-09-04 LAB — CBC WITH DIFFERENTIAL/PLATELET
BASOS PCT: 0.3 % (ref 0.0–3.0)
Basophils Absolute: 0 10*3/uL (ref 0.0–0.1)
Eosinophils Absolute: 0.2 10*3/uL (ref 0.0–0.7)
Eosinophils Relative: 3.8 % (ref 0.0–5.0)
HCT: 40.7 % (ref 36.0–46.0)
HEMOGLOBIN: 13.9 g/dL (ref 12.0–15.0)
LYMPHS PCT: 26.1 % (ref 12.0–46.0)
Lymphs Abs: 1.7 10*3/uL (ref 0.7–4.0)
MCHC: 34.1 g/dL (ref 30.0–36.0)
MCV: 89 fl (ref 78.0–100.0)
MONOS PCT: 7.7 % (ref 3.0–12.0)
Monocytes Absolute: 0.5 10*3/uL (ref 0.1–1.0)
NEUTROS ABS: 4.1 10*3/uL (ref 1.4–7.7)
NEUTROS PCT: 62.1 % (ref 43.0–77.0)
Platelets: 303 10*3/uL (ref 150.0–400.0)
RBC: 4.57 Mil/uL (ref 3.87–5.11)
RDW: 12.5 % (ref 11.5–14.6)
WBC: 6.6 10*3/uL (ref 4.5–10.5)

## 2013-09-04 LAB — HEPATIC FUNCTION PANEL
ALBUMIN: 4.2 g/dL (ref 3.5–5.2)
ALT: 20 U/L (ref 0–35)
AST: 20 U/L (ref 0–37)
Alkaline Phosphatase: 39 U/L (ref 39–117)
BILIRUBIN DIRECT: 0 mg/dL (ref 0.0–0.3)
Total Bilirubin: 0.6 mg/dL (ref 0.3–1.2)
Total Protein: 6.7 g/dL (ref 6.0–8.3)

## 2013-09-04 LAB — LIPID PANEL
CHOL/HDL RATIO: 4
Cholesterol: 220 mg/dL — ABNORMAL HIGH (ref 0–200)
HDL: 56.5 mg/dL (ref >39.00)
LDL Cholesterol: 140 mg/dL — ABNORMAL HIGH (ref 0–99)
Triglycerides: 120 mg/dL (ref 0.0–149.0)
VLDL: 24 mg/dL (ref 0.0–40.0)

## 2013-09-04 LAB — TSH: TSH: 3 u[IU]/mL (ref 0.35–5.50)

## 2013-09-04 MED ORDER — LEVONORGESTREL 20 MCG/24HR IU IUD: 1.0000 | INTRAUTERINE_SYSTEM | Freq: Once | INTRAUTERINE | Status: DC

## 2013-09-04 NOTE — Assessment & Plan Note (Signed)
Currently not taking meds

## 2013-09-04 NOTE — Progress Notes (Signed)
Subjective:     Kari Lopez is a 43 y.o. female and is here for a comprehensive physical exam. The patient reports no problems.  History   Social History  . Marital Status: Married    Spouse Name: Loraine LericheMark    Number of Children: N/A  . Years of Education: N/A   Occupational History  .  [employer]   Social History Main Topics  . Smoking status: Never Smoker   . Smokeless tobacco: Never Used  . Alcohol Use: Yes     Comment: rarely  . Drug Use: No  . Sexual Activity: Yes    Partners: Male   Other Topics Concern  . Not on file   Social History Narrative   Exercise--- gym ,  3x a week   Health Maintenance  Topic Date Due  . Influenza Vaccine  09/05/2014 (Originally 01/27/2013)  . Pap Smear  07/07/2016  . Tetanus/tdap  04/15/2021    The following portions of the patient's history were reviewed and updated as appropriate:  She  has a past medical history of Depression. She  does not have any pertinent problems on file. She  has past surgical history that includes Carpal tunnel release. Her family history includes Cancer in her other; Coronary artery disease in her other; Diabetes in her other; Hyperlipidemia in her other; Hypertension in her other. She  reports that she has never smoked. She has never used smokeless tobacco. She reports that she drinks alcohol. She reports that she does not use illicit drugs. She has a current medication list which includes the following prescription(s): hypercare, omeprazole, sertraline, and levonorgestrel. Current Outpatient Prescriptions on File Prior to Visit  Medication Sig Dispense Refill  . HYPERCARE 20 % external solution APPLY TOPICALLY AT BEDTIME  35 mL  0  . omeprazole (PRILOSEC) 20 MG capsule Take 1 capsule (20 mg total) by mouth daily.  90 capsule  3  . sertraline (ZOLOFT) 100 MG tablet 1 tab by mouth daily--  30 tablet  11   No current facility-administered medications on file prior to visit.   She is allergic to  codeine..  Review of Systems Review of Systems  Constitutional: Negative for activity change, appetite change and fatigue.  HENT: Negative for hearing loss, congestion, tinnitus and ear discharge.  dentist q60100m Eyes: Negative for visual disturbance (see optho q2y -- vision corrected to 20/20 with glasses).  Respiratory: Negative for cough, chest tightness and shortness of breath.   Cardiovascular: Negative for chest pain, palpitations and leg swelling.  Gastrointestinal: Negative for abdominal pain, diarrhea, constipation and abdominal distention.  Genitourinary: Negative for urgency, frequency, decreased urine volume and difficulty urinating.  Musculoskeletal: Negative for back pain, arthralgias and gait problem.  Skin: Negative for color change, pallor and rash.  Neurological: Negative for dizziness, light-headedness, numbness and headaches.  Hematological: Negative for adenopathy. Does not bruise/bleed easily.  Psychiatric/Behavioral: Negative for suicidal ideas, confusion, sleep disturbance, self-injury, dysphoric mood, decreased concentration and agitation.       Objective:    BP 120/80  Pulse 66  Temp(Src) 98.4 F (36.9 C) (Oral)  Ht 5\' 8"  (1.727 m)  Wt 197 lb (89.359 kg)  BMI 29.96 kg/m2  SpO2 97% General appearance: alert, cooperative, appears stated age and no distress Head: Normocephalic, without obvious abnormality, atraumatic Eyes: conjunctivae/corneas clear. PERRL, EOM's intact. Fundi benign. Ears: normal TM's and external ear canals both ears Nose: Nares normal. Septum midline. Mucosa normal. No drainage or sinus tenderness. Throat: lips, mucosa, and  tongue normal; teeth and gums normal Neck: no adenopathy, no carotid bruit, no JVD, supple, symmetrical, trachea midline and thyroid not enlarged, symmetric, no tenderness/mass/nodules Back: symmetric, no curvature. ROM normal. No CVA tenderness. Lungs: clear to auscultation bilaterally Breasts: gyn Heart: regular  rate and rhythm, S1, S2 normal, no murmur, click, rub or gallop Abdomen: soft, non-tender; bowel sounds normal; no masses,  no organomegaly Pelvic: deferred--gyn Extremities: extremities normal, atraumatic, no cyanosis or edema Pulses: 2+ and symmetric Skin: Skin color, texture, turgor normal. No rashes or lesions Lymph nodes: Cervical, supraclavicular, and axillary nodes normal. Neurologic: Alert and oriented X 3, normal strength and tone. Normal symmetric reflexes. Normal coordination and gait Psych-- no anxiety, no depression      Assessment:    Healthy female exam.      Plan:    ghm utd Check labs See After Visit Summary for Counseling Recommendations

## 2013-09-04 NOTE — Patient Instructions (Signed)

## 2013-09-04 NOTE — Progress Notes (Signed)
Pre visit review using our clinic review tool, if applicable. No additional management support is needed unless otherwise documented below in the visit note. 

## 2013-09-05 LAB — POCT URINALYSIS DIPSTICK
Bilirubin, UA: NEGATIVE
Blood, UA: NEGATIVE
Glucose, UA: NEGATIVE
KETONES UA: NEGATIVE
LEUKOCYTES UA: NEGATIVE
Nitrite, UA: NEGATIVE
PH UA: 6
Protein, UA: NEGATIVE
Spec Grav, UA: <=1.005
UROBILINOGEN UA: 0.2

## 2013-09-05 NOTE — Telephone Encounter (Signed)
Unable to reach pre visit.  

## 2013-09-25 ENCOUNTER — Ambulatory Visit (HOSPITAL_BASED_OUTPATIENT_CLINIC_OR_DEPARTMENT_OTHER)
Admission: RE | Admit: 2013-09-25 | Discharge: 2013-09-25 | Disposition: A | Discharge status: Home / Self Care | Payer: BC Managed Care – PPO | Source: Ambulatory Visit | Attending: Family Medicine | Admitting: Family Medicine

## 2013-09-25 DIAGNOSIS — Z1231 Encounter for screening mammogram for malignant neoplasm of breast: Secondary | ICD-10-CM | POA: Insufficient documentation

## 2013-09-25 DIAGNOSIS — Z1239 Encounter for other screening for malignant neoplasm of breast: Secondary | ICD-10-CM

## 2014-03-04 ENCOUNTER — Other Ambulatory Visit: Payer: Self-pay | Admitting: Family Medicine

## 2014-03-06 ENCOUNTER — Encounter: Payer: Self-pay | Admitting: Family Medicine

## 2014-03-06 ENCOUNTER — Ambulatory Visit (INDEPENDENT_AMBULATORY_CARE_PROVIDER_SITE_OTHER): Payer: BC Managed Care – PPO | Admitting: Family Medicine

## 2014-03-06 VITALS — BP 130/82 | HR 85 | Temp 99.0°F | Wt 195.1 lb

## 2014-03-06 DIAGNOSIS — F3289 Other specified depressive episodes: Secondary | ICD-10-CM

## 2014-03-06 DIAGNOSIS — F32A Depression, unspecified: Secondary | ICD-10-CM

## 2014-03-06 DIAGNOSIS — F329 Major depressive disorder, single episode, unspecified: Secondary | ICD-10-CM

## 2014-03-06 MED ORDER — SERTRALINE HCL 100 MG PO TABS: ORAL_TABLET | ORAL | Status: DC

## 2014-03-06 MED ORDER — ALUMINUM CHLORIDE 20 % EX SOLN: CUTANEOUS | Status: DC

## 2014-03-06 MED ORDER — OMEPRAZOLE 20 MG PO CPDR: DELAYED_RELEASE_CAPSULE | ORAL | Status: DC

## 2014-03-06 NOTE — Progress Notes (Signed)
   Subjective:    Patient ID: Kari Lopez, female    DOB: 26-Sep-1970, 43 y.o.   MRN: [id number]  HPI Pt here to discuss her zoloft.  She would like to increase the dose a little but overall feels like it is working well.     Review of Systems As above    Objective:   Physical Exam  BP 130/82  Pulse 85  Temp(Src) 99 F (37.2 C) (Oral)  Wt 195 lb 1.7 oz (88.5 kg)  SpO2 96% General appearance: alert, cooperative, appears stated age and no distress Neck: no adenopathy, supple, symmetrical, trachea midline and thyroid not enlarged, symmetric, no tenderness/mass/nodules Lungs: clear to auscultation bilaterally Heart: S1, S2 normal Extremities: extremities normal, atraumatic, no cyanosis or edema Neurologic: Alert and oriented X 3, normal strength and tone. Normal symmetric reflexes. Normal coordination and gait       Assessment & Plan:  1. Depression Needs to restart zoloft--- pt requesting to inc dose - sertraline (ZOLOFT) 100 MG tablet; 1 1/2 tabs by mouth daily--  Dispense: 45 tablet; Refill: 11

## 2014-03-06 NOTE — Patient Instructions (Signed)

## 2014-03-06 NOTE — Progress Notes (Signed)
Pre visit review using our clinic review tool, if applicable. No additional management support is needed unless otherwise documented below in the visit note. 

## 2014-03-08 ENCOUNTER — Telehealth: Payer: Self-pay | Admitting: Family Medicine

## 2014-03-08 NOTE — Telephone Encounter (Signed)
Received paperwork for Sertaline PA, forward to nurse

## 2014-03-13 NOTE — Telephone Encounter (Signed)
PA signed by . Lowne and faxed awaiting for approval.

## 2014-03-23 ENCOUNTER — Other Ambulatory Visit: Payer: Self-pay

## 2014-03-23 MED ORDER — ALUMINUM CHLORIDE 20 % EX SOLN: CUTANEOUS | Status: DC

## 2014-03-29 NOTE — Telephone Encounter (Signed)
Prior authorization for Sertraline 100 mg tabs approved effective 03/18/2014 through 03/19/2015. Approval letter sent for scanning. [id number]

## 2014-03-30 ENCOUNTER — Other Ambulatory Visit: Payer: Self-pay

## 2014-03-30 MED ORDER — ALUMINUM CHLORIDE 20 % EX SOLN: Freq: Every day | CUTANEOUS | Status: DC

## 2014-03-30 NOTE — Telephone Encounter (Signed)
Fax from the pharmacy advising that Hypercare is unavailable. They have aluminum chloride lotion. Per Dr.Lowne Drysol apply at bedtime prn. 1 bottle with 2 refills.  Rx faxed    KP

## 2014-04-16 ENCOUNTER — Ambulatory Visit (INDEPENDENT_AMBULATORY_CARE_PROVIDER_SITE_OTHER): Payer: BC Managed Care – PPO | Admitting: Psychology

## 2014-04-16 DIAGNOSIS — F4323 Adjustment disorder with mixed anxiety and depressed mood: Secondary | ICD-10-CM

## 2014-05-04 ENCOUNTER — Ambulatory Visit (INDEPENDENT_AMBULATORY_CARE_PROVIDER_SITE_OTHER): Payer: BC Managed Care – PPO | Admitting: Psychology

## 2014-05-04 DIAGNOSIS — F4323 Adjustment disorder with mixed anxiety and depressed mood: Secondary | ICD-10-CM

## 2015-03-11 ENCOUNTER — Other Ambulatory Visit: Payer: Self-pay | Admitting: Family Medicine

## 2015-03-25 ENCOUNTER — Telehealth: Payer: Self-pay | Admitting: *Deleted

## 2015-03-25 NOTE — Telephone Encounter (Signed)
Unable to reach patient at time of Pre-Visit Call.  Left message to confirm appointment.  

## 2015-03-26 ENCOUNTER — Ambulatory Visit (INDEPENDENT_AMBULATORY_CARE_PROVIDER_SITE_OTHER): Payer: BLUE CROSS/BLUE SHIELD | Admitting: Family Medicine

## 2015-03-26 ENCOUNTER — Encounter: Payer: Self-pay | Admitting: Family Medicine

## 2015-03-26 VITALS — BP 120/80 | HR 88 | Temp 98.3°F | Ht 67.75 in | Wt 188.0 lb

## 2015-03-26 DIAGNOSIS — Z0000 Encounter for general adult medical examination without abnormal findings: Secondary | ICD-10-CM

## 2015-03-26 MED ORDER — ALUMINUM CHLORIDE 20 % EX SOLN: Freq: Every day | CUTANEOUS | Status: DC

## 2015-03-26 NOTE — Patient Instructions (Signed)
Preventive Care for Adults A healthy lifestyle and preventive care can promote health and wellness. Preventive health guidelines for women include the following key practices.  A routine yearly physical is a good way to check with your health care provider about your health and preventive screening. It is a chance to share any concerns and updates on your health and to receive a thorough exam.  Visit your dentist for a routine exam and preventive care every 6 months. Brush your teeth twice a day and floss once a day. Good oral hygiene prevents tooth decay and gum disease.  The frequency of eye exams is based on your age, health, family medical history, use of contact lenses, and other factors. Follow your health care provider's recommendations for frequency of eye exams.  Eat a healthy diet. Foods like vegetables, fruits, whole grains, low-fat dairy products, and lean protein foods contain the nutrients you need without too many calories. Decrease your intake of foods high in solid fats, added sugars, and salt. Eat the right amount of calories for you.Get information about a proper diet from your health care provider, if necessary.  Regular physical exercise is one of the most important things you can do for your health. Most adults should get at least 150 minutes of moderate-intensity exercise (any activity that increases your heart rate and causes you to sweat) each week. In addition, most adults need muscle-strengthening exercises on 2 or more days a week.  Maintain a healthy weight. The body mass index (BMI) is a screening tool to identify possible weight problems. It provides an estimate of body fat based on height and weight. Your health care provider can find your BMI and can help you achieve or maintain a healthy weight.For adults 20 years and older:  A BMI below 18.5 is considered underweight.  A BMI of 18.5 to 24.9 is normal.  A BMI of 25 to 29.9 is considered overweight.  A BMI of  30 and above is considered obese.  Maintain normal blood lipids and cholesterol levels by exercising and minimizing your intake of saturated fat. Eat a balanced diet with plenty of fruit and vegetables. Blood tests for lipids and cholesterol should begin at age 76 and be repeated every 5 years. If your lipid or cholesterol levels are high, you are over 50, or you are at high risk for heart disease, you may need your cholesterol levels checked more frequently.Ongoing high lipid and cholesterol levels should be treated with medicines if diet and exercise are not working.  If you smoke, find out from your health care provider how to quit. If you do not use tobacco, do not start.  Lung cancer screening is recommended for adults aged 22-80 years who are at high risk for developing lung cancer because of a history of smoking. A yearly low-dose CT scan of the lungs is recommended for people who have at least a 30-pack-year history of smoking and are a current smoker or have quit within the past 15 years. A pack year of smoking is smoking an average of 1 pack of cigarettes a day for 1 year (for example: 1 pack a day for 30 years or 2 packs a day for 15 years). Yearly screening should continue until the smoker has stopped smoking for at least 15 years. Yearly screening should be stopped for people who develop a health problem that would prevent them from having lung cancer treatment.  If you are pregnant, do not drink alcohol. If you are breastfeeding,  be very cautious about drinking alcohol. If you are not pregnant and choose to drink alcohol, do not have more than 1 drink per day. One drink is considered to be 12 ounces (355 mL) of beer, 5 ounces (148 mL) of wine, or 1.5 ounces (44 mL) of liquor.  Avoid use of street drugs. Do not share needles with anyone. Ask for help if you need support or instructions about stopping the use of drugs.  High blood pressure causes heart disease and increases the risk of  stroke. Your blood pressure should be checked at least every 1 to 2 years. Ongoing high blood pressure should be treated with medicines if weight loss and exercise do not work.  If you are 3-86 years old, ask your health care provider if you should take aspirin to prevent strokes.  Diabetes screening involves taking a blood sample to check your fasting blood sugar level. This should be done once every 3 years, after age 67, if you are within normal weight and without risk factors for diabetes. Testing should be considered at a younger age or be carried out more frequently if you are overweight and have at least 1 risk factor for diabetes.  Breast cancer screening is essential preventive care for women. You should practice "breast self-awareness." This means understanding the normal appearance and feel of your breasts and may include breast self-examination. Any changes detected, no matter how small, should be reported to a health care provider. Women in their 8s and 30s should have a clinical breast exam (CBE) by a health care provider as part of a regular health exam every 1 to 3 years. After age 70, women should have a CBE every year. Starting at age 25, women should consider having a mammogram (breast X-ray test) every year. Women who have a family history of breast cancer should talk to their health care provider about genetic screening. Women at a high risk of breast cancer should talk to their health care providers about having an MRI and a mammogram every year.  Breast cancer gene (BRCA)-related cancer risk assessment is recommended for women who have family members with BRCA-related cancers. BRCA-related cancers include breast, ovarian, tubal, and peritoneal cancers. Having family members with these cancers may be associated with an increased risk for harmful changes (mutations) in the breast cancer genes BRCA1 and BRCA2. Results of the assessment will determine the need for genetic counseling and  BRCA1 and BRCA2 testing.  Routine pelvic exams to screen for cancer are no longer recommended for nonpregnant women who are considered low risk for cancer of the pelvic organs (ovaries, uterus, and vagina) and who do not have symptoms. Ask your health care provider if a screening pelvic exam is right for you.  If you have had past treatment for cervical cancer or a condition that could lead to cancer, you need Pap tests and screening for cancer for at least 20 years after your treatment. If Pap tests have been discontinued, your risk factors (such as having a new sexual partner) need to be reassessed to determine if screening should be resumed. Some women have medical problems that increase the chance of getting cervical cancer. In these cases, your health care provider may recommend more frequent screening and Pap tests.  The HPV test is an additional test that may be used for cervical cancer screening. The HPV test looks for the virus that can cause the cell changes on the cervix. The cells collected during the Pap test can be  tested for HPV. The HPV test could be used to screen women aged 30 years and older, and should be used in women of any age who have unclear Pap test results. After the age of 30, women should have HPV testing at the same frequency as a Pap test.  Colorectal cancer can be detected and often prevented. Most routine colorectal cancer screening begins at the age of 50 years and continues through age 75 years. However, your health care provider may recommend screening at an earlier age if you have risk factors for colon cancer. On a yearly basis, your health care provider may provide home test kits to check for hidden blood in the stool. Use of a small camera at the end of a tube, to directly examine the colon (sigmoidoscopy or colonoscopy), can detect the earliest forms of colorectal cancer. Talk to your health care provider about this at age 50, when routine screening begins. Direct  exam of the colon should be repeated every 5-10 years through age 75 years, unless early forms of pre-cancerous polyps or small growths are found.  People who are at an increased risk for hepatitis B should be screened for this virus. You are considered at high risk for hepatitis B if:  You were born in a country where hepatitis B occurs often. Talk with your health care provider about which countries are considered high risk.  Your parents were born in a high-risk country and you have not received a shot to protect against hepatitis B (hepatitis B vaccine).  You have HIV or AIDS.  You use needles to inject street drugs.  You live with, or have sex with, someone who has hepatitis B.  You get hemodialysis treatment.  You take certain medicines for conditions like cancer, organ transplantation, and autoimmune conditions.  Hepatitis C blood testing is recommended for all people born from 1945 through 1965 and any individual with known risks for hepatitis C.  Practice safe sex. Use condoms and avoid high-risk sexual practices to reduce the spread of sexually transmitted infections (STIs). STIs include gonorrhea, chlamydia, syphilis, trichomonas, herpes, HPV, and human immunodeficiency virus (HIV). Herpes, HIV, and HPV are viral illnesses that have no cure. They can result in disability, cancer, and death.  You should be screened for sexually transmitted illnesses (STIs) including gonorrhea and chlamydia if:  You are sexually active and are younger than 24 years.  You are older than 24 years and your health care provider tells you that you are at risk for this type of infection.  Your sexual activity has changed since you were last screened and you are at an increased risk for chlamydia or gonorrhea. Ask your health care provider if you are at risk.  If you are at risk of being infected with HIV, it is recommended that you take a prescription medicine daily to prevent HIV infection. This is  called preexposure prophylaxis (PrEP). You are considered at risk if:  You are a heterosexual woman, are sexually active, and are at increased risk for HIV infection.  You take drugs by injection.  You are sexually active with a partner who has HIV.  Talk with your health care provider about whether you are at high risk of being infected with HIV. If you choose to begin PrEP, you should first be tested for HIV. You should then be tested every 3 months for as long as you are taking PrEP.  Osteoporosis is a disease in which the bones lose minerals and strength   with aging. This can result in serious bone fractures or breaks. The risk of osteoporosis can be identified using a bone density scan. Women ages 65 years and over and women at risk for fractures or osteoporosis should discuss screening with their health care providers. Ask your health care provider whether you should take a calcium supplement or vitamin D to reduce the rate of osteoporosis.  Menopause can be associated with physical symptoms and risks. Hormone replacement therapy is available to decrease symptoms and risks. You should talk to your health care provider about whether hormone replacement therapy is right for you.  Use sunscreen. Apply sunscreen liberally and repeatedly throughout the day. You should seek shade when your shadow is shorter than you. Protect yourself by wearing long sleeves, pants, a wide-brimmed hat, and sunglasses year round, whenever you are outdoors.  Once a month, do a whole body skin exam, using a mirror to look at the skin on your back. Tell your health care provider of new moles, moles that have irregular borders, moles that are larger than a pencil eraser, or moles that have changed in shape or color.  Stay current with required vaccines (immunizations).  Influenza vaccine. All adults should be immunized every year.  Tetanus, diphtheria, and acellular pertussis (Td, Tdap) vaccine. Pregnant women should  receive 1 dose of Tdap vaccine during each pregnancy. The dose should be obtained regardless of the length of time since the last dose. Immunization is preferred during the 27th-36th week of gestation. An adult who has not previously received Tdap or who does not know her vaccine status should receive 1 dose of Tdap. This initial dose should be followed by tetanus and diphtheria toxoids (Td) booster doses every 10 years. Adults with an unknown or incomplete history of completing a 3-dose immunization series with Td-containing vaccines should begin or complete a primary immunization series including a Tdap dose. Adults should receive a Td booster every 10 years.  Varicella vaccine. An adult without evidence of immunity to varicella should receive 2 doses or a second dose if she has previously received 1 dose. Pregnant females who do not have evidence of immunity should receive the first dose after pregnancy. This first dose should be obtained before leaving the health care facility. The second dose should be obtained 4-8 weeks after the first dose.  Human papillomavirus (HPV) vaccine. Females aged 13-26 years who have not received the vaccine previously should obtain the 3-dose series. The vaccine is not recommended for use in pregnant females. However, pregnancy testing is not needed before receiving a dose. If a female is found to be pregnant after receiving a dose, no treatment is needed. In that case, the remaining doses should be delayed until after the pregnancy. Immunization is recommended for any person with an immunocompromised condition through the age of 26 years if she did not get any or all doses earlier. During the 3-dose series, the second dose should be obtained 4-8 weeks after the first dose. The third dose should be obtained 24 weeks after the first dose and 16 weeks after the second dose.  Zoster vaccine. One dose is recommended for adults aged 60 years or older unless certain conditions are  present.  Measles, mumps, and rubella (MMR) vaccine. Adults born before 1957 generally are considered immune to measles and mumps. Adults born in 1957 or later should have 1 or more doses of MMR vaccine unless there is a contraindication to the vaccine or there is laboratory evidence of immunity to   each of the three diseases. A routine second dose of MMR vaccine should be obtained at least 28 days after the first dose for students attending postsecondary schools, health care workers, or international travelers. People who received inactivated measles vaccine or an unknown type of measles vaccine during 1963-1967 should receive 2 doses of MMR vaccine. People who received inactivated mumps vaccine or an unknown type of mumps vaccine before 1979 and are at high risk for mumps infection should consider immunization with 2 doses of MMR vaccine. For females of childbearing age, rubella immunity should be determined. If there is no evidence of immunity, females who are not pregnant should be vaccinated. If there is no evidence of immunity, females who are pregnant should delay immunization until after pregnancy. Unvaccinated health care workers born before 1957 who lack laboratory evidence of measles, mumps, or rubella immunity or laboratory confirmation of disease should consider measles and mumps immunization with 2 doses of MMR vaccine or rubella immunization with 1 dose of MMR vaccine.  Pneumococcal 13-valent conjugate (PCV13) vaccine. When indicated, a person who is uncertain of her immunization history and has no record of immunization should receive the PCV13 vaccine. An adult aged 19 years or older who has certain medical conditions and has not been previously immunized should receive 1 dose of PCV13 vaccine. This PCV13 should be followed with a dose of pneumococcal polysaccharide (PPSV23) vaccine. The PPSV23 vaccine dose should be obtained at least 8 weeks after the dose of PCV13 vaccine. An adult aged 19  years or older who has certain medical conditions and previously received 1 or more doses of PPSV23 vaccine should receive 1 dose of PCV13. The PCV13 vaccine dose should be obtained 1 or more years after the last PPSV23 vaccine dose.  Pneumococcal polysaccharide (PPSV23) vaccine. When PCV13 is also indicated, PCV13 should be obtained first. All adults aged 65 years and older should be immunized. An adult younger than age 65 years who has certain medical conditions should be immunized. Any person who resides in a nursing home or long-term care facility should be immunized. An adult smoker should be immunized. People with an immunocompromised condition and certain other conditions should receive both PCV13 and PPSV23 vaccines. People with human immunodeficiency virus (HIV) infection should be immunized as soon as possible after diagnosis. Immunization during chemotherapy or radiation therapy should be avoided. Routine use of PPSV23 vaccine is not recommended for American Indians, [place] Natives, or people younger than 65 years unless there are medical conditions that require PPSV23 vaccine. When indicated, people who have unknown immunization and have no record of immunization should receive PPSV23 vaccine. One-time revaccination 5 years after the first dose of PPSV23 is recommended for people aged 19-64 years who have chronic kidney failure, nephrotic syndrome, asplenia, or immunocompromised conditions. People who received 1-2 doses of PPSV23 before age 65 years should receive another dose of PPSV23 vaccine at age 65 years or later if at least 5 years have passed since the previous dose. Doses of PPSV23 are not needed for people immunized with PPSV23 at or after age 65 years.  Meningococcal vaccine. Adults with asplenia or persistent complement component deficiencies should receive 2 doses of quadrivalent meningococcal conjugate (MenACWY-D) vaccine. The doses should be obtained at least 2 months apart.  Microbiologists working with certain meningococcal bacteria, military recruits, people at risk during an outbreak, and people who travel to or live in countries with a high rate of meningitis should be immunized. A first-year college student up through age   21 years who is living in a residence hall should receive a dose if she did not receive a dose on or after her 16th birthday. Adults who have certain high-risk conditions should receive one or more doses of vaccine.  Hepatitis A vaccine. Adults who wish to be protected from this disease, have certain high-risk conditions, work with hepatitis A-infected animals, work in hepatitis A research labs, or travel to or work in countries with a high rate of hepatitis A should be immunized. Adults who were previously unvaccinated and who anticipate close contact with an international adoptee during the first 60 days after arrival in the [place] from a country with a high rate of hepatitis A should be immunized.  Hepatitis B vaccine. Adults who wish to be protected from this disease, have certain high-risk conditions, may be exposed to blood or other infectious body fluids, are household contacts or sex partners of hepatitis B positive people, are clients or workers in certain care facilities, or travel to or work in countries with a high rate of hepatitis B should be immunized.  Haemophilus influenzae type b (Hib) vaccine. A previously unvaccinated person with asplenia or sickle cell disease or having a scheduled splenectomy should receive 1 dose of Hib vaccine. Regardless of previous immunization, a recipient of a hematopoietic stem cell transplant should receive a 3-dose series 6-12 months after her successful transplant. Hib vaccine is not recommended for adults with HIV infection. Preventive Services / Frequency Ages 64 to 68 years  Blood pressure check.** / Every 1 to 2 years.  Lipid and cholesterol check.** / Every 5 years beginning at age  22.  Clinical breast exam.** / Every 3 years for women in their 88s and 53s.  BRCA-related cancer risk assessment.** / For women who have family members with a BRCA-related cancer (breast, ovarian, tubal, or peritoneal cancers).  Pap test.** / Every 2 years from ages 90 through 51. Every 3 years starting at age 21 through age 56 or 3 with a history of 3 consecutive normal Pap tests.  HPV screening.** / Every 3 years from ages 24 through ages 1 to 46 with a history of 3 consecutive normal Pap tests.  Hepatitis C blood test.** / For any individual with known risks for hepatitis C.  Skin self-exam. / Monthly.  Influenza vaccine. / Every year.  Tetanus, diphtheria, and acellular pertussis (Tdap, Td) vaccine.** / Consult your health care provider. Pregnant women should receive 1 dose of Tdap vaccine during each pregnancy. 1 dose of Td every 10 years.  Varicella vaccine.** / Consult your health care provider. Pregnant females who do not have evidence of immunity should receive the first dose after pregnancy.  HPV vaccine. / 3 doses over 6 months, if 72 and younger. The vaccine is not recommended for use in pregnant females. However, pregnancy testing is not needed before receiving a dose.  Measles, mumps, rubella (MMR) vaccine.** / You need at least 1 dose of MMR if you were born in 1957 or later. You may also need a 2nd dose. For females of childbearing age, rubella immunity should be determined. If there is no evidence of immunity, females who are not pregnant should be vaccinated. If there is no evidence of immunity, females who are pregnant should delay immunization until after pregnancy.  Pneumococcal 13-valent conjugate (PCV13) vaccine.** / Consult your health care provider.  Pneumococcal polysaccharide (PPSV23) vaccine.** / 1 to 2 doses if you smoke cigarettes or if you have certain conditions.  Meningococcal vaccine.** /  1 dose if you are age 19 to 21 years and a first-year college  student living in a residence hall, or have one of several medical conditions, you need to get vaccinated against meningococcal disease. You may also need additional booster doses.  Hepatitis A vaccine.** / Consult your health care provider.  Hepatitis B vaccine.** / Consult your health care provider.  Haemophilus influenzae type b (Hib) vaccine.** / Consult your health care provider. Ages 40 to 64 years  Blood pressure check.** / Every 1 to 2 years.  Lipid and cholesterol check.** / Every 5 years beginning at age 20 years.  Lung cancer screening. / Every year if you are aged 55-80 years and have a 30-pack-year history of smoking and currently smoke or have quit within the past 15 years. Yearly screening is stopped once you have quit smoking for at least 15 years or develop a health problem that would prevent you from having lung cancer treatment.  Clinical breast exam.** / Every year after age 40 years.  BRCA-related cancer risk assessment.** / For women who have family members with a BRCA-related cancer (breast, ovarian, tubal, or peritoneal cancers).  Mammogram.** / Every year beginning at age 40 years and continuing for as long as you are in good health. Consult with your health care provider.  Pap test.** / Every 3 years starting at age 30 years through age 65 or 70 years with a history of 3 consecutive normal Pap tests.  HPV screening.** / Every 3 years from ages 30 years through ages 65 to 70 years with a history of 3 consecutive normal Pap tests.  Fecal occult blood test (FOBT) of stool. / Every year beginning at age 50 years and continuing until age 75 years. You may not need to do this test if you get a colonoscopy every 10 years.  Flexible sigmoidoscopy or colonoscopy.** / Every 5 years for a flexible sigmoidoscopy or every 10 years for a colonoscopy beginning at age 50 years and continuing until age 75 years.  Hepatitis C blood test.** / For all people born from 1945 through  1965 and any individual with known risks for hepatitis C.  Skin self-exam. / Monthly.  Influenza vaccine. / Every year.  Tetanus, diphtheria, and acellular pertussis (Tdap/Td) vaccine.** / Consult your health care provider. Pregnant women should receive 1 dose of Tdap vaccine during each pregnancy. 1 dose of Td every 10 years.  Varicella vaccine.** / Consult your health care provider. Pregnant females who do not have evidence of immunity should receive the first dose after pregnancy.  Zoster vaccine.** / 1 dose for adults aged 60 years or older.  Measles, mumps, rubella (MMR) vaccine.** / You need at least 1 dose of MMR if you were born in 1957 or later. You may also need a 2nd dose. For females of childbearing age, rubella immunity should be determined. If there is no evidence of immunity, females who are not pregnant should be vaccinated. If there is no evidence of immunity, females who are pregnant should delay immunization until after pregnancy.  Pneumococcal 13-valent conjugate (PCV13) vaccine.** / Consult your health care provider.  Pneumococcal polysaccharide (PPSV23) vaccine.** / 1 to 2 doses if you smoke cigarettes or if you have certain conditions.  Meningococcal vaccine.** / Consult your health care provider.  Hepatitis A vaccine.** / Consult your health care provider.  Hepatitis B vaccine.** / Consult your health care provider.  Haemophilus influenzae type b (Hib) vaccine.** / Consult your health care provider. Ages 65   years and over  Blood pressure check.** / Every 1 to 2 years.  Lipid and cholesterol check.** / Every 5 years beginning at age 70 years.  Lung cancer screening. / Every year if you are aged 37-80 years and have a 30-pack-year history of smoking and currently smoke or have quit within the past 15 years. Yearly screening is stopped once you have quit smoking for at least 15 years or develop a health problem that would prevent you from having lung cancer  treatment.  Clinical breast exam.** / Every year after age 53 years.  BRCA-related cancer risk assessment.** / For women who have family members with a BRCA-related cancer (breast, ovarian, tubal, or peritoneal cancers).  Mammogram.** / Every year beginning at age 55 years and continuing for as long as you are in good health. Consult with your health care provider.  Pap test.** / Every 3 years starting at age 91 years through age 16 or 3 years with 3 consecutive normal Pap tests. Testing can be stopped between 65 and 70 years with 3 consecutive normal Pap tests and no abnormal Pap or HPV tests in the past 10 years.  HPV screening.** / Every 3 years from ages 38 years through ages 58 or 54 years with a history of 3 consecutive normal Pap tests. Testing can be stopped between 65 and 70 years with 3 consecutive normal Pap tests and no abnormal Pap or HPV tests in the past 10 years.  Fecal occult blood test (FOBT) of stool. / Every year beginning at age 27 years and continuing until age 69 years. You may not need to do this test if you get a colonoscopy every 10 years.  Flexible sigmoidoscopy or colonoscopy.** / Every 5 years for a flexible sigmoidoscopy or every 10 years for a colonoscopy beginning at age 78 years and continuing until age 60 years.  Hepatitis C blood test.** / For all people born from [birth year] through 1966 and any individual with known risks for hepatitis C.  Osteoporosis screening.** / A one-time screening for women ages 39 years and over and women at risk for fractures or osteoporosis.  Skin self-exam. / Monthly.  Influenza vaccine. / Every year.  Tetanus, diphtheria, and acellular pertussis (Tdap/Td) vaccine.** / 1 dose of Td every 10 years.  Varicella vaccine.** / Consult your health care provider.  Zoster vaccine.** / 1 dose for adults aged 73 years or older.  Pneumococcal 13-valent conjugate (PCV13) vaccine.** / Consult your health care provider.  Pneumococcal  polysaccharide (PPSV23) vaccine.** / 1 dose for all adults aged 42 years and older.  Meningococcal vaccine.** / Consult your health care provider.  Hepatitis A vaccine.** / Consult your health care provider.  Hepatitis B vaccine.** / Consult your health care provider.  Haemophilus influenzae type b (Hib) vaccine.** / Consult your health care provider. ** Family history and personal history of risk and conditions may change your health care provider's recommendations. Document Released: 08/11/2001 Document Revised: 10/30/2013 Document Reviewed: 11/10/2010 [facility] Patient Information 2015 [place], [place]. This information is not intended to replace advice given to you by your health care provider. Make sure you discuss any questions you have with your health care provider.

## 2015-03-26 NOTE — Progress Notes (Signed)
Pre visit review using our clinic review tool, if applicable. No additional management support is needed unless otherwise documented below in the visit note. 

## 2015-03-26 NOTE — Progress Notes (Signed)
Subjective:     Kari Lopez is a 44 y.o. female and is here for a comprehensive physical exam. The patient reports no problems.  Social History   Social History  . Marital Status: Married    Spouse Name: Loraine Leriche  . Number of Children: N/A  . Years of Education: N/A   Occupational History  .  [employer]   Social History Main Topics  . Smoking status: Never Smoker   . Smokeless tobacco: Never Used  . Alcohol Use: Yes     Comment: rarely  . Drug Use: No  . Sexual Activity:    Partners: Male   Other Topics Concern  . Not on file   Social History Narrative   Exercise--- gym ,  3x a week   Health Maintenance  Topic Date Due  . HIV Screening  03/29/1985  . INFLUENZA VACCINE  01/28/2015  . MAMMOGRAM  09/19/2015  . PAP SMEAR  07/07/2016  . TETANUS/TDAP  04/15/2021    The following portions of the patient's history were reviewed and updated as appropriate:  She  has a past medical history of Depression. She  does not have any pertinent problems on file. She  has past surgical history that includes Carpal tunnel release. Her family history includes Cancer in her other; Coronary artery disease in her other; Diabetes in her other; Hyperlipidemia in her other; Hypertension in her other. She  reports that she has never smoked. She has never used smokeless tobacco. She reports that she drinks alcohol. She reports that she does not use illicit drugs. She has a current medication list which includes the following prescription(s): aluminum chloride, levonorgestrel, omeprazole, and sertraline. Current Outpatient Prescriptions on File Prior to Visit  Medication Sig Dispense Refill  . levonorgestrel (MIRENA) 20 MCG/24HR IUD 1 Intra Uterine Device (1 each total) by Intrauterine route once. 1 each 0  . omeprazole (PRILOSEC) 20 MG capsule TAKE 1 CAPSULE BY MOUTH ONCE DAILY 90 capsule 3  . sertraline (ZOLOFT) 100 MG tablet take ONE & ONE-HALF (1 & 1/2) tablets by mouth once daily 45  tablet 0   No current facility-administered medications on file prior to visit.   She is allergic to codeine..  Review of Systems Review of Systems  Constitutional: Negative for activity change, appetite change and fatigue.  HENT: Negative for hearing loss, congestion, tinnitus and ear discharge.  dentist q39m Eyes: Negative for visual disturbance (see optho q1y -- vision corrected to 20/20 with glasses).  Respiratory: Negative for cough, chest tightness and shortness of breath.   Cardiovascular: Negative for chest pain, palpitations and leg swelling.  Gastrointestinal: Negative for abdominal pain, diarrhea, constipation and abdominal distention.  Genitourinary: Negative for urgency, frequency, decreased urine volume and difficulty urinating.  Musculoskeletal: Negative for back pain, arthralgias and gait problem.  Skin: Negative for color change, pallor and rash.  Neurological: Negative for dizziness, light-headedness, numbness and headaches.  Hematological: Negative for adenopathy. Does not bruise/bleed easily.  Psychiatric/Behavioral: Negative for suicidal ideas, confusion, sleep disturbance, self-injury, dysphoric mood, decreased concentration and agitation.       Objective:    BP 120/80 mmHg  Pulse 88  Temp(Src) 98.3 F (36.8 C) (Oral)  Ht 5' 7.75" (1.721 m)  Wt 188 lb (85.276 kg)  BMI 28.79 kg/m2  SpO2 99% General appearance: alert, cooperative, appears stated age and no distress Head: Normocephalic, without obvious abnormality, atraumatic Eyes: conjunctivae/corneas clear. PERRL, EOM's intact. Fundi benign. Ears: normal TM's and external ear canals both ears  Nose: Nares normal. Septum midline. Mucosa normal. No drainage or sinus tenderness. Throat: lips, mucosa, and tongue normal; teeth and gums normal Neck: no adenopathy, no carotid bruit, no JVD, supple, symmetrical, trachea midline and thyroid not enlarged, symmetric, no tenderness/mass/nodules Back: symmetric, no  curvature. ROM normal. No CVA tenderness. Lungs: clear to auscultation bilaterally Breasts: gyn Heart: regular rate and rhythm, S1, S2 normal, no murmur, click, rub or gallop Abdomen: soft, non-tender; bowel sounds normal; no masses,  no organomegaly Pelvic: deferred--gyn Extremities: extremities normal, atraumatic, no cyanosis or edema Pulses: 2+ and symmetric Skin: Skin color, texture, turgor normal. No rashes or lesions Lymph nodes: Cervical, supraclavicular, and axillary nodes normal. Neurologic: Alert and oriented X 3, normal strength and tone. Normal symmetric reflexes. Normal coordination and gait Psych- no depression, no anxiety      Assessment:    Healthy female exam.      Plan:    ghm utd Check labs See After Visit Summary for Counseling Recommendations

## 2015-03-27 LAB — COMPREHENSIVE METABOLIC PANEL
ALBUMIN: 4.6 g/dL (ref 3.5–5.2)
ALK PHOS: 53 U/L (ref 39–117)
ALT: 18 U/L (ref 0–35)
AST: 17 U/L (ref 0–37)
BUN: 12 mg/dL (ref 6–23)
CHLORIDE: 105 meq/L (ref 96–112)
CO2: 28 mEq/L (ref 19–32)
CREATININE: 0.64 mg/dL (ref 0.40–1.20)
Calcium: 9.5 mg/dL (ref 8.4–10.5)
GFR: 106.66 mL/min (ref >60.00)
Glucose, Bld: 89 mg/dL (ref 70–99)
Potassium: 4 mEq/L (ref 3.5–5.1)
SODIUM: 140 meq/L (ref 135–145)
TOTAL PROTEIN: 7.1 g/dL (ref 6.0–8.3)
Total Bilirubin: 0.4 mg/dL (ref 0.2–1.2)

## 2015-03-27 LAB — CBC WITH DIFFERENTIAL/PLATELET
Basophils Absolute: 0.1 10*3/uL (ref 0.0–0.1)
Basophils Relative: 1.4 % (ref 0.0–3.0)
EOS ABS: 0.2 10*3/uL (ref 0.0–0.7)
EOS PCT: 2.5 % (ref 0.0–5.0)
HEMATOCRIT: 41.2 % (ref 36.0–46.0)
HEMOGLOBIN: 14.1 g/dL (ref 12.0–15.0)
LYMPHS PCT: 24.4 % (ref 12.0–46.0)
Lymphs Abs: 1.8 10*3/uL (ref 0.7–4.0)
MCHC: 34.2 g/dL (ref 30.0–36.0)
MCV: 89.7 fl (ref 78.0–100.0)
Monocytes Absolute: 0.8 10*3/uL (ref 0.1–1.0)
Monocytes Relative: 10.6 % (ref 3.0–12.0)
Neutro Abs: 4.4 10*3/uL (ref 1.4–7.7)
Neutrophils Relative %: 61.1 % (ref 43.0–77.0)
Platelets: 269 10*3/uL (ref 150.0–400.0)
RBC: 4.6 Mil/uL (ref 3.87–5.11)
RDW: 12.7 % (ref 11.5–15.5)
WBC: 7.2 10*3/uL (ref 4.0–10.5)

## 2015-03-27 LAB — HEPATIC FUNCTION PANEL
ALT: 18 U/L (ref 0–35)
AST: 17 U/L (ref 0–37)
Albumin: 4.6 g/dL (ref 3.5–5.2)
Alkaline Phosphatase: 53 U/L (ref 39–117)
BILIRUBIN DIRECT: 0.1 mg/dL (ref 0.0–0.3)
TOTAL PROTEIN: 7.1 g/dL (ref 6.0–8.3)
Total Bilirubin: 0.4 mg/dL (ref 0.2–1.2)

## 2015-03-27 LAB — LIPID PANEL
Cholesterol: 212 mg/dL — ABNORMAL HIGH (ref 0–200)
HDL: 61.4 mg/dL (ref >39.00)
LDL Cholesterol: 132 mg/dL — ABNORMAL HIGH (ref 0–99)
NonHDL: 150.73
TRIGLYCERIDES: 93 mg/dL (ref 0.0–149.0)
Total CHOL/HDL Ratio: 3
VLDL: 18.6 mg/dL (ref 0.0–40.0)

## 2015-03-27 LAB — POCT URINALYSIS DIPSTICK
BILIRUBIN UA: NEGATIVE
Blood, UA: NEGATIVE
GLUCOSE UA: NEGATIVE
Ketones, UA: NEGATIVE
LEUKOCYTES UA: NEGATIVE
NITRITE UA: NEGATIVE
Protein, UA: NEGATIVE
Spec Grav, UA: 1.025
Urobilinogen, UA: 0.2
pH, UA: 7

## 2015-03-27 LAB — TSH: TSH: 3.51 u[IU]/mL (ref 0.35–4.50)

## 2015-03-27 LAB — HIV ANTIBODY (ROUTINE TESTING W REFLEX): HIV: NONREACTIVE

## 2015-04-22 ENCOUNTER — Other Ambulatory Visit: Payer: Self-pay | Admitting: Family Medicine

## 2015-06-20 ENCOUNTER — Other Ambulatory Visit: Payer: Self-pay | Admitting: Family Medicine

## 2015-08-28 ENCOUNTER — Telehealth: Payer: Self-pay | Admitting: Family Medicine

## 2015-08-28 NOTE — Telephone Encounter (Signed)
Left message for patient to call about Flu Shot °

## 2015-09-20 ENCOUNTER — Other Ambulatory Visit: Payer: Self-pay | Admitting: Family Medicine

## 2015-10-21 DIAGNOSIS — M7672 Peroneal tendinitis, left leg: Secondary | ICD-10-CM | POA: Diagnosis not present

## 2015-10-21 DIAGNOSIS — S97102A Crushing injury of unspecified left toe(s), initial encounter: Secondary | ICD-10-CM | POA: Diagnosis not present

## 2015-10-21 DIAGNOSIS — M7752 Other enthesopathy of left foot: Secondary | ICD-10-CM | POA: Diagnosis not present

## 2015-11-26 ENCOUNTER — Other Ambulatory Visit: Payer: Self-pay | Admitting: Family Medicine

## 2016-03-03 ENCOUNTER — Other Ambulatory Visit: Payer: Self-pay | Admitting: Family Medicine

## 2016-03-26 ENCOUNTER — Encounter: Payer: BLUE CROSS/BLUE SHIELD | Admitting: Family Medicine

## 2016-03-30 ENCOUNTER — Other Ambulatory Visit: Payer: Self-pay | Admitting: Family Medicine

## 2016-08-11 ENCOUNTER — Ambulatory Visit (INDEPENDENT_AMBULATORY_CARE_PROVIDER_SITE_OTHER): Payer: BLUE CROSS/BLUE SHIELD | Admitting: Medical

## 2016-08-11 ENCOUNTER — Telehealth: Payer: Self-pay | Admitting: Medical

## 2016-08-11 ENCOUNTER — Ambulatory Visit (HOSPITAL_BASED_OUTPATIENT_CLINIC_OR_DEPARTMENT_OTHER)
Admission: RE | Admit: 2016-08-11 | Discharge: 2016-08-11 | Disposition: A | Discharge status: Home / Self Care | Payer: BLUE CROSS/BLUE SHIELD | Source: Ambulatory Visit | Attending: Medical | Admitting: Medical

## 2016-08-11 ENCOUNTER — Encounter: Payer: Self-pay | Admitting: Medical

## 2016-08-11 VITALS — BP 143/93 | HR 71 | Temp 98.4°F | Resp 16 | Ht 67.75 in | Wt 183.4 lb

## 2016-08-11 DIAGNOSIS — R109 Unspecified abdominal pain: Secondary | ICD-10-CM | POA: Diagnosis not present

## 2016-08-11 DIAGNOSIS — F329 Major depressive disorder, single episode, unspecified: Secondary | ICD-10-CM | POA: Diagnosis not present

## 2016-08-11 DIAGNOSIS — R1900 Intra-abdominal and pelvic swelling, mass and lump, unspecified site: Secondary | ICD-10-CM | POA: Diagnosis not present

## 2016-08-11 DIAGNOSIS — F32A Depression, unspecified: Secondary | ICD-10-CM

## 2016-08-11 DIAGNOSIS — R1907 Generalized intra-abdominal and pelvic swelling, mass and lump: Secondary | ICD-10-CM | POA: Diagnosis not present

## 2016-08-11 MED ORDER — POTASSIUM CHLORIDE ER 10 MEQ PO TBCR: EXTENDED_RELEASE_TABLET | ORAL | 3 refills | Status: DC

## 2016-08-11 MED ORDER — SERTRALINE HCL 100 MG PO TABS: 150.0000 mg | ORAL_TABLET | Freq: Every day | ORAL | 3 refills | Status: DC

## 2016-08-11 NOTE — Patient Instructions (Addendum)
For your pin point area of pain abdomen may be lipoma vs early hernia. Will get US of area but if this does not reveal diagnosis then may need to get ct abdomen. Please go down stairs to schedule the study. I tried to call/schedule  but no one picked up.  You did mention excess gas and think this may be diet/increase vegetable related .Could try gas-x or phazyme.  For your hx of depression will rx refill of your sertraline.   Follow up in 7-10 days or as needed

## 2016-08-11 NOTE — Progress Notes (Signed)
Pre visit review using our clinic review tool, if applicable. No additional management support is needed unless otherwise documented below in the visit note/SLS  

## 2016-08-11 NOTE — Progress Notes (Signed)
Subjective:    Patient ID: Kari Lopez, female    DOB: 07/15/1970, 46 y.o.   MRN: [id number]  HPI  Pt in with some abdomen.  Pt in with description of "wierd abdomen feeling". She states no pain. She describes various points of transient moving tenderness. Discomfort that she feels is in left upper quadrant region. At times it feels like a bubble. Pt states no diarrhea. Last bm was yesterday was normal. Small bm today. Pt has been eating a lot of vegetables over past 2 weeks. Last couple of days passing little more gas. She has found area that will consistently produce faint pain on palpation and feels small lump.  No nauseau, no vomiting. No fever. No black or blood stools. Pt has no family history of colon cancer.  Pt has IUD mirena for a while. 15 years and changed out twice.  Pt in past used to be on omeprazole. She used to be on daily  But now only as needed. No epigastric pain.  Pt had been on sertraline up until 5 weeks ago when she ran out. She took for both mood and anxiety. But she never got medication refilled. Husband states she should get back on med. Pt admits irritable and very moody.     Review of Systems  Constitutional: Negative for chills, fatigue and fever.  Respiratory: Negative for cough, chest tightness, shortness of breath and wheezing.   Cardiovascular: Negative for palpitations.  Gastrointestinal: Negative for abdominal distention, abdominal pain, blood in stool, constipation, diarrhea, nausea, rectal pain and vomiting.       Faint gas and small faint lump/bulge.  She denies pain states faint tender at best.  Recent bm yesterday.  Genitourinary: Negative for difficulty urinating, dysuria, hematuria and urgency.  Musculoskeletal: Negative for back pain.  Skin: Negative for rash.  Neurological: Negative for headaches.  Psychiatric/Behavioral: Positive for agitation and dysphoric mood. Negative for hallucinations, self-injury, sleep disturbance and  suicidal ideas. The patient is not nervous/anxious.        See hpi. More easily irritated and moody.   Past Medical History:  Diagnosis Date  . Depression      Social History   Social History  . Marital status: Married    Spouse name: Loraine LericheMark  . Number of children: N/A  . Years of education: N/A   Occupational History  .  [employer]   Social History Main Topics  . Smoking status: Never Smoker  . Smokeless tobacco: Never Used  . Alcohol use Yes     Comment: rarely  . Drug use: No  . Sexual activity: Yes    Partners: Male   Other Topics Concern  . Not on file   Social History Narrative   Exercise--- gym ,  3x a week    Past Surgical History:  Procedure Laterality Date  . CARPAL TUNNEL RELEASE      Family History  Problem Relation Age of Onset  . Coronary artery disease Other   . Diabetes Other   . Hyperlipidemia Other   . Hypertension Other   . Cancer Other     LUNG    Allergies  Allergen Reactions  . Codeine     Unknown reaction    Current Outpatient Prescriptions on File Prior to Visit  Medication Sig Dispense Refill  . DRYSOL 20 % external solution APPLY TOPICALLY at bedtime 35 mL 2  . levonorgestrel (MIRENA) 20 MCG/24HR IUD 1 Intra Uterine Device (1 each total) by Intrauterine  route once. 1 each 0  . omeprazole (PRILOSEC) 20 MG capsule Take one capsule by mouth daily (Patient not taking: Reported on 08/11/2016) 90 capsule 0   No current facility-administered medications on file prior to visit.     BP (!) 143/93 (BP Location: Left Arm, Patient Position: Sitting, Cuff Size: Large)   Pulse 71   Temp 98.4 F (36.9 C) (Oral)   Resp 16   Ht 5' 7.75" (1.721 m)   Wt 183 lb 6 oz (83.2 kg)   SpO2 100%   BMI 28.09 kg/m       Objective:   Physical Exam  General Appearance- Not in acute distress.  HEENT Eyes- Scleraeral/Conjuntiva-bilat- Not Yellow. Mouth & Throat- Normal.  Chest and Lung Exam Auscultation: Breath  sounds:-Normal. Adventitious sounds:- No Adventitious sounds.  Cardiovascular Auscultation:Rythm - Regular. Heart Sounds -Normal heart sounds.  Abdomen Inspection:-Inspection Normal.  Palpation/Perucssion: Palpation and Percussion of the abdomen reveal- 2 inches left of umbilicus faint Tender area feels like small lipoma(possible hernia?), On straight leg lift no increase in size of the area. No Rebound tenderness, No rigidity(Guarding) and No Palpable abdominal masses.  Liver:-Normal.  Spleen:- Normal.   Back- no cva tenderness.     Assessment & Plan:  For your pin point area of pain may be lipoma vs early hernia. Will get [place] of area but if this does not reveal diagnosis then may need to get ct abdomen. Please go down stairs to schedule the study. I tried to call/schedule  but no one picked up.  You did mention excess gas and think this may be diet/increase vegetable related .Could try gas-x or phazyme.  For your hx of depression will rx refill of your sertraline.   Follow up in 7-10 days or as needed

## 2016-08-11 NOTE — Telephone Encounter (Signed)
rx k-dur sent to his pharmacy.

## 2016-09-18 ENCOUNTER — Encounter: Payer: Self-pay | Admitting: Family Medicine

## 2016-11-24 ENCOUNTER — Telehealth: Payer: Self-pay | Admitting: Family Medicine

## 2016-11-24 NOTE — Telephone Encounter (Signed)
Relation to BJ:YNWGpt:self Call back number: [phone] Pharmacy: [facility]  Reason for call:  Patient requesting 90 day supply sertraline (ZOLOFT) 100 MG tablet, please send to [facility] and remove all retail pharmacy.

## 2016-11-25 MED ORDER — SERTRALINE HCL 100 MG PO TABS: 150.0000 mg | ORAL_TABLET | Freq: Every day | ORAL | 0 refills | Status: DC

## 2017-01-13 ENCOUNTER — Other Ambulatory Visit: Payer: Self-pay | Admitting: Family Medicine

## 2017-05-06 ENCOUNTER — Telehealth: Payer: Self-pay | Admitting: Family Medicine

## 2017-05-06 MED ORDER — SERTRALINE HCL 100 MG PO TABS: 150.0000 mg | ORAL_TABLET | Freq: Every day | ORAL | 0 refills | Status: DC

## 2017-05-06 NOTE — Telephone Encounter (Signed)
Pt took last ZOLOFT today. [facility]. Pt has appt with Lowne 05/13/17.

## 2017-05-06 NOTE — Telephone Encounter (Signed)
Rx sent as requested.

## 2017-05-13 ENCOUNTER — Encounter: Payer: Self-pay | Admitting: Family Medicine

## 2017-05-13 ENCOUNTER — Ambulatory Visit (INDEPENDENT_AMBULATORY_CARE_PROVIDER_SITE_OTHER): Payer: BLUE CROSS/BLUE SHIELD | Admitting: Family Medicine

## 2017-05-13 VITALS — BP 133/68 | HR 67 | Temp 99.1°F | Resp 16 | Ht 68.0 in | Wt 187.8 lb

## 2017-05-13 DIAGNOSIS — K219 Gastro-esophageal reflux disease without esophagitis: Secondary | ICD-10-CM

## 2017-05-13 DIAGNOSIS — F321 Major depressive disorder, single episode, moderate: Secondary | ICD-10-CM | POA: Diagnosis not present

## 2017-05-13 MED ORDER — OMEPRAZOLE 20 MG PO CPDR: 20.0000 mg | DELAYED_RELEASE_CAPSULE | Freq: Every day | ORAL | 1 refills | Status: DC

## 2017-05-13 MED ORDER — SERTRALINE HCL 100 MG PO TABS
150.0000 mg | ORAL_TABLET | Freq: Every day | ORAL | 1 refills | Status: DC
Start: 2017-05-13 — End: 2017-09-12

## 2017-05-13 NOTE — Patient Instructions (Signed)
Major Depressive Disorder, Adult Major depressive disorder (MDD) is a mental health condition. It may also be called clinical depression or unipolar depression. MDD usually causes feelings of sadness, hopelessness, or helplessness. MDD can also cause physical symptoms. It can interfere with work, school, relationships, and other everyday activities. MDD may be mild, moderate, or severe. It may occur once (single episode major depressive disorder) or it may occur multiple times (recurrent major depressive disorder). What are the causes? The exact cause of this condition is not known. MDD is most likely caused by a combination of things, which may include:  Genetic factors. These are traits that are passed along from parent to child.  Individual factors. Your personality, your behavior, and the way you handle your thoughts and feelings may contribute to MDD. This includes personality traits and behaviors learned from others.  Physical factors, such as: ? Differences in the part of your brain that controls emotion. This part of your brain may be different than it is in people who do not have MDD. ? Long-term (chronic) medical or psychiatric illnesses.  Social factors. Traumatic experiences or major life changes may play a role in the development of MDD.  What increases the risk? This condition is more likely to develop in women. The following factors may also make you more likely to develop MDD:  A family history of depression.  Troubled family relationships.  Abnormally low levels of certain brain chemicals.  Traumatic events in childhood, especially abuse or the loss of a parent.  Being under a lot of stress, or long-term stress, especially from upsetting life experiences or losses.  A history of: ? Chronic physical illness. ? Other mental health disorders. ? Substance abuse.  Poor living conditions.  Experiencing social exclusion or discrimination on a regular basis.  What are  the signs or symptoms? The main symptoms of MDD typically include:  Constant depressed or irritable mood.  Loss of interest in things and activities.  MDD symptoms may also include:  Sleeping or eating too much or too little.  Unexplained weight change.  Fatigue or low energy.  Feelings of worthlessness or guilt.  Difficulty thinking clearly or making decisions.  Thoughts of suicide or of harming others.  Physical agitation or weakness.  Isolation.  Severe cases of MDD may also occur with other symptoms, such as:  Delusions or hallucinations, in which you imagine things that are not real (psychotic depression).  Low-level depression that lasts at least a year (chronic depression or persistent depressive disorder).  Extreme sadness and hopelessness (melancholic depression).  Trouble speaking and moving (catatonic depression).  How is this diagnosed? This condition may be diagnosed based on:  Your symptoms.  Your medical history, including your mental health history. This may involve tests to evaluate your mental health. You may be asked questions about your lifestyle, including any drug and alcohol use, and how long you have had symptoms of MDD.  A physical exam.  Blood tests to rule out other conditions.  You must have a depressed mood and at least four other MDD symptoms most of the day, nearly every day in the same 2-week timeframe before your health care provider can confirm a diagnosis of MDD. How is this treated? This condition is usually treated by mental health professionals, such as psychologists, psychiatrists, and clinical social workers. You may need more than one type of treatment. Treatment may include:  Psychotherapy. This is also called talk therapy or counseling. Types of psychotherapy include: ? Cognitive behavioral   therapy (CBT). This type of therapy teaches you to recognize unhealthy feelings, thoughts, and behaviors, and replace them with  positive thoughts and actions. ? Interpersonal therapy (IPT). This helps you to improve the way you relate to and communicate with others. ? Family therapy. This treatment includes members of your family.  Medicine to treat anxiety and depression, or to help you control certain emotions and behaviors.  Lifestyle changes, such as: ? Limiting alcohol and drug use. ? Exercising regularly. ? Getting plenty of sleep. ? Making healthy eating choices. ? Spending more time outdoors.  Treatments involving stimulation of the brain can be used in situations with extremely severe symptoms, or when medicine or other therapies do not work over time. These treatments include electroconvulsive therapy, transcranial magnetic stimulation, and vagal nerve stimulation. Follow these instructions at home: Activity  Return to your normal activities as told by your health care provider.  Exercise regularly and spend time outdoors as told by your health care provider. General instructions  Take over-the-counter and prescription medicines only as told by your health care provider.  Do not drink alcohol. If you drink alcohol, limit your alcohol intake to no more than 1 drink a day for nonpregnant women and 2 drinks a day for men. One drink equals 12 oz of beer, 5 oz of wine, or 1 oz of hard liquor. Alcohol can affect any antidepressant medicines you are taking. Talk to your health care provider about your alcohol use.  Eat a healthy diet and get plenty of sleep.  Find activities that you enjoy doing, and make time to do them.  Consider joining a support group. Your health care provider may be able to recommend a support group.  Keep all follow-up visits as told by your health care provider. This is important. Where to find more information: National Alliance on Mental Illness  www.nami.org  U.S. National Institute of Mental Health  www.nimh.nih.gov  National Suicide Prevention  Lifeline  1-800-273-TALK (8255). This is free, 24-hour help.  Contact a health care provider if:  Your symptoms get worse.  You develop new symptoms. Get help right away if:  You self-harm.  You have serious thoughts about hurting yourself or others.  You see, hear, taste, smell, or feel things that are not present (hallucinate). This information is not intended to replace advice given to you by your health care provider. Make sure you discuss any questions you have with your health care provider. Document Released: 10/10/2012 Document Revised: 02/20/2016 Document Reviewed: 12/25/2015 [employer] Interactive Patient Education  2017 [employer]  

## 2017-05-16 DIAGNOSIS — K219 Gastro-esophageal reflux disease without esophagitis: Secondary | ICD-10-CM | POA: Insufficient documentation

## 2017-05-16 DIAGNOSIS — F321 Major depressive disorder, single episode, moderate: Secondary | ICD-10-CM | POA: Insufficient documentation

## 2017-05-16 NOTE — Progress Notes (Signed)
Patient ID: Kari Saintina L Aoun, female    DOB: 10/18/1970  Age: [age] y.o. MRN: [id number]    Subjective:  Subjective  HPI Kari Lopez presents for med refills and f/u depression.  No complaints.   Review of Systems  Constitutional: Negative for appetite change, diaphoresis, fatigue and unexpected weight change.  Eyes: Negative for pain, redness and visual disturbance.  Respiratory: Negative for cough, chest tightness, shortness of breath and wheezing.   Cardiovascular: Negative for chest pain, palpitations and leg swelling.  Endocrine: Negative for cold intolerance, heat intolerance, polydipsia, polyphagia and polyuria.  Genitourinary: Negative for difficulty urinating, dysuria and frequency.  Neurological: Negative for dizziness, light-headedness, numbness and headaches.    History Past Medical History:  Diagnosis Date  . Depression     She has a past surgical history that includes Carpal tunnel release.   Her family history includes Cancer in her other; Coronary artery disease in her other; Diabetes in her other; Hyperlipidemia in her other; Hypertension in her other.She reports that  has never smoked. she has never used smokeless tobacco. She reports that she drinks alcohol. She reports that she does not use drugs.  Current Outpatient Medications on File Prior to Visit  Medication Sig Dispense Refill  . DRYSOL 20 % external solution APPLY TOPICALLY at bedtime 35 mL 2  . levonorgestrel (MIRENA) 20 MCG/24HR IUD 1 Intra Uterine Device (1 each total) by Intrauterine route once. 1 each 0   No current facility-administered medications on file prior to visit.      Objective:  Objective  Physical Exam  Constitutional: She is oriented to person, place, and time. She appears well-developed and well-nourished.  HENT:  Head: Normocephalic and atraumatic.  Eyes: Conjunctivae and EOM are normal.  Neck: Normal range of motion. Neck supple. No JVD present. Carotid bruit is not present. No  thyromegaly present.  Cardiovascular: Normal rate, regular rhythm and normal heart sounds.  No murmur heard. Pulmonary/Chest: Effort normal and breath sounds normal. No respiratory distress. She has no wheezes. She has no rales. She exhibits no tenderness.  Musculoskeletal: She exhibits no edema.  Neurological: She is alert and oriented to person, place, and time.  Psychiatric: Her speech is normal and behavior is normal. Judgment and thought content normal. Her mood appears not anxious. Her affect is not angry, not blunt, not labile and not inappropriate. She does not exhibit a depressed mood.  Nursing note and vitals reviewed.  BP 133/68   Pulse 67   Temp 99.1 F (37.3 C) (Oral)   Resp 16   Ht 5\' 8"  (1.727 m)   Wt 187 lb 12.8 oz (85.2 kg)   SpO2 98%   BMI 28.55 kg/m  Wt Readings from Last 3 Encounters:  05/13/17 187 lb 12.8 oz (85.2 kg)  08/11/16 183 lb 6 oz (83.2 kg)  03/26/15 188 lb (85.3 kg)     Lab Results  Component Value Date   WBC 7.2 03/26/2015   HGB 14.1 03/26/2015   HCT 41.2 03/26/2015   PLT 269.0 03/26/2015   GLUCOSE 89 03/26/2015   CHOL 212 (H) 03/26/2015   TRIG 93.0 03/26/2015   HDL 61.40 03/26/2015   LDLDIRECT 184.0 02/06/2013   LDLCALC 132 (H) 03/26/2015   ALT 18 03/26/2015   ALT 18 03/26/2015   AST 17 03/26/2015   AST 17 03/26/2015   NA 140 03/26/2015   K 4.0 03/26/2015   CL 105 03/26/2015   CREATININE 0.64 03/26/2015   BUN 12 03/26/2015  CO2 28 03/26/2015   TSH 3.51 03/26/2015    [place] Abdomen Limited  Result Date: 08/11/2016 CLINICAL DATA:  Palpable mass just to the left of the umbilicus. EXAM: US ABDOMEN LIMITED - left mid abdomen COMPARISON:  None. FINDINGS: Soft tissue scanning of the abdominal wall was performed where the patient felt a small lump. This was about 6 cm to the left of the umbilicus. The area involve to looks like normal skin, subcutaneous tissue and subcutaneous fat. There is no sign of a cyst or definable mass. IMPRESSION:  Negative scan of the area of concern. No definable structure, only normal appearing tissues. Certainly, subcutaneous fat can be lobular. Electronically Signed   By: Paulina FusiMark  Shogry M.D.   On: 08/11/2016 15:32     Assessment & Plan:  Plan  I have discontinued Chad L. Sookram's potassium chloride. I have also changed her omeprazole. Additionally, I am having her maintain her levonorgestrel, DRYSOL, and sertraline.  Meds ordered this encounter  Medications  . omeprazole (PRILOSEC) 20 MG capsule    Sig: Take 1 capsule (20 mg total) daily by mouth.    Dispense:  90 capsule    Refill:  1  . sertraline (ZOLOFT) 100 MG tablet    Sig: Take 1.5 tablets (150 mg total) daily by mouth.    Dispense:  135 tablet    Refill:  1    Problem List Items Addressed This Visit      Unprioritized   Depression, major, single episode, moderate (HCC) - Primary    Refill meds rto for cpe or sooner prn      Relevant Medications   sertraline (ZOLOFT) 100 MG tablet   GERD (gastroesophageal reflux disease)    Refill omeprazole Stable       Relevant Medications   omeprazole (PRILOSEC) 20 MG capsule      Follow-up: Return in about 1 year (around 05/13/2018), or if symptoms worsen or fail to improve, for annual exam, fasting.  Donato SchultzYvonne R Lowne Chase, DO

## 2017-05-16 NOTE — Assessment & Plan Note (Signed)
Refill omeprazole Stable

## 2017-05-16 NOTE — Assessment & Plan Note (Signed)
Refill meds rto for cpe or sooner prn

## 2017-09-12 ENCOUNTER — Other Ambulatory Visit: Payer: Self-pay | Admitting: Family Medicine

## 2018-05-04 ENCOUNTER — Other Ambulatory Visit: Payer: Self-pay | Admitting: Family Medicine

## 2018-07-28 ENCOUNTER — Other Ambulatory Visit: Payer: Self-pay | Admitting: Family Medicine

## 2018-08-25 ENCOUNTER — Other Ambulatory Visit: Payer: Self-pay | Admitting: Family Medicine

## 2019-02-13 ENCOUNTER — Other Ambulatory Visit: Payer: Self-pay

## 2019-02-13 ENCOUNTER — Ambulatory Visit (INDEPENDENT_AMBULATORY_CARE_PROVIDER_SITE_OTHER): Payer: BC Managed Care – PPO | Admitting: Family Medicine

## 2019-02-13 ENCOUNTER — Encounter: Payer: Self-pay | Admitting: Family Medicine

## 2019-02-13 VITALS — BP 157/97 | HR 70 | Temp 97.8°F | Resp 18 | Ht 68.0 in | Wt 203.4 lb

## 2019-02-13 DIAGNOSIS — Z30431 Encounter for routine checking of intrauterine contraceptive device: Secondary | ICD-10-CM | POA: Diagnosis not present

## 2019-02-13 DIAGNOSIS — F320 Major depressive disorder, single episode, mild: Secondary | ICD-10-CM

## 2019-02-13 DIAGNOSIS — R232 Flushing: Secondary | ICD-10-CM

## 2019-02-13 DIAGNOSIS — I10 Essential (primary) hypertension: Secondary | ICD-10-CM | POA: Insufficient documentation

## 2019-02-13 MED ORDER — LOSARTAN POTASSIUM 50 MG PO TABS: 50.0000 mg | ORAL_TABLET | Freq: Every day | ORAL | 3 refills | Status: DC

## 2019-02-13 MED ORDER — ESCITALOPRAM OXALATE 10 MG PO TABS: 10.0000 mg | ORAL_TABLET | Freq: Every day | ORAL | 2 refills | Status: DC

## 2019-02-13 NOTE — Patient Instructions (Signed)

## 2019-02-13 NOTE — Assessment & Plan Note (Signed)
Poorly controlled will alter medications, encouraged DASH diet, minimize caffeine and obtain adequate sleep. Report concerning symptoms and follow up as directed and as needed 

## 2019-02-13 NOTE — Progress Notes (Signed)
Patient ID: Kari Lopez, female    DOB: 11/05/1970  Age: 48 y.o. MRN: [id number]    Subjective:  Subjective  HPI Kari Lopez presents for bp check.  Her bp has been running high-- she was having an eval for liposuction and her bp was high in plastic surgeons office.  She is also having bad hot flashes and her IUD may need to come out.  She cant remember how long ago it was put in.    Review of Systems  Constitutional: Negative for activity change, appetite change, fatigue and unexpected weight change.  Respiratory: Negative for cough and shortness of breath.   Cardiovascular: Negative for chest pain and palpitations.  Psychiatric/Behavioral: Negative for behavioral problems and dysphoric mood. The patient is not nervous/anxious.     History Past Medical History:  Diagnosis Date   Depression     She has a past surgical history that includes Carpal tunnel release.   Her family history includes Cancer in an other family member; Coronary artery disease in an other family member; Diabetes in an other family member; Hyperlipidemia in an other family member; Hypertension in an other family member.She reports that she has never smoked. She has never used smokeless tobacco. She reports current alcohol use. She reports that she does not use drugs.  Current Outpatient Medications on File Prior to Visit  Medication Sig Dispense Refill   DRYSOL 20 % external solution APPLY TOPICALLY at bedtime 35 mL 2   levonorgestrel (MIRENA) 20 MCG/24HR IUD 1 Intra Uterine Device (1 each total) by Intrauterine route once. 1 each 0   omeprazole (PRILOSEC) 20 MG capsule TAKE 1 CAPSULE BY MOUTH  ONCE DAILY 90 capsule 1   sertraline (ZOLOFT) 100 MG tablet TAKE 1 AND 1/2 TABLETS BY  MOUTH DAILY 45 tablet 0   No current facility-administered medications on file prior to visit.      Objective:  Objective  Physical Exam Vitals signs and nursing note reviewed.  Constitutional:      Appearance: She is  well-developed.  HENT:     Head: Normocephalic and atraumatic.  Eyes:     Conjunctiva/sclera: Conjunctivae normal.  Neck:     Musculoskeletal: Normal range of motion and neck supple.     Thyroid: No thyromegaly.     Vascular: No carotid bruit or JVD.  Cardiovascular:     Rate and Rhythm: Normal rate and regular rhythm.     Heart sounds: Normal heart sounds. No murmur.  Pulmonary:     Effort: Pulmonary effort is normal. No respiratory distress.     Breath sounds: Normal breath sounds. No wheezing or rales.  Chest:     Chest wall: No tenderness.  Neurological:     Mental Status: She is alert and oriented to person, place, and time.    BP (!) 157/97 (BP Location: Left Arm, Patient Position: Sitting, Cuff Size: Normal)    Pulse 70    Temp 97.8 F (36.6 C) (Temporal)    Resp 18    Ht 5\' 8"  (1.727 m)    Wt 203 lb 6.4 oz (92.3 kg)    SpO2 100%    BMI 30.93 kg/m  Wt Readings from Last 3 Encounters:  02/13/19 203 lb 6.4 oz (92.3 kg)  05/13/17 187 lb 12.8 oz (85.2 kg)  08/11/16 183 lb 6 oz (83.2 kg)     Lab Results  Component Value Date   WBC 7.2 03/26/2015   HGB 14.1 03/26/2015   HCT 41.2  03/26/2015   PLT 269.0 03/26/2015   GLUCOSE 89 03/26/2015   CHOL 212 (H) 03/26/2015   TRIG 93.0 03/26/2015   HDL 61.40 03/26/2015   LDLDIRECT 184.0 02/06/2013   LDLCALC 132 (H) 03/26/2015   ALT 18 03/26/2015   ALT 18 03/26/2015   AST 17 03/26/2015   AST 17 03/26/2015   NA 140 03/26/2015   K 4.0 03/26/2015   CL 105 03/26/2015   CREATININE 0.64 03/26/2015   BUN 12 03/26/2015   CO2 28 03/26/2015   TSH 3.51 03/26/2015    US Abdomen Limited  Result Date: 08/11/2016 CLINICAL DATA:  Palpable mass just to the left of the umbilicus. EXAM: US ABDOMEN LIMITED - left mid abdomen COMPARISON:  None. FINDINGS: Soft tissue scanning of the abdominal wall was performed where the patient felt a small lump. This was about 6 cm to the left of the umbilicus. The area involve to looks like normal skin,  subcutaneous tissue and subcutaneous fat. There is no sign of a cyst or definable mass. IMPRESSION: Negative scan of the area of concern. No definable structure, only normal appearing tissues. Certainly, subcutaneous fat can be lobular. Electronically Signed   By: Nelson Chimes M.D.   On: 08/11/2016 15:32     Assessment & Plan:  Plan  I am having Kari Lopez start on escitalopram and losartan. I am also having her maintain her levonorgestrel, Drysol, omeprazole, and sertraline.  Meds ordered this encounter  Medications   escitalopram (LEXAPRO) 10 MG tablet    Sig: Take 1 tablet (10 mg total) by mouth daily.    Dispense:  30 tablet    Refill:  2   losartan (COZAAR) 50 MG tablet    Sig: Take 1 tablet (50 mg total) by mouth daily.    Dispense:  30 tablet    Refill:  3    Problem List Items Addressed This Visit      Unprioritized   HTN (hypertension)    Poorly controlled will alter medications, encouraged DASH diet, minimize caffeine and obtain adequate sleep. Report concerning symptoms and follow up as directed and as needed      Relevant Medications   losartan (COZAAR) 50 MG tablet    Other Visit Diagnoses    Depression, major, single episode, mild ([place])    -  Primary   Relevant Medications   escitalopram (LEXAPRO) 10 MG tablet   Hot flashes       Relevant Medications   escitalopram (LEXAPRO) 10 MG tablet   losartan (COZAAR) 50 MG tablet   Other Relevant Orders   Ambulatory referral to Obstetrics / Gynecology   IUD check up       Relevant Orders   Ambulatory referral to Obstetrics / Gynecology      Follow-up: Return in about 2 weeks (around 02/27/2019), or if symptoms worsen or fail to improve, for hypertension.  Ann Held, DO

## 2019-02-21 ENCOUNTER — Ambulatory Visit (INDEPENDENT_AMBULATORY_CARE_PROVIDER_SITE_OTHER): Payer: BC Managed Care – PPO | Admitting: Family Medicine

## 2019-02-21 ENCOUNTER — Other Ambulatory Visit: Payer: Self-pay

## 2019-02-21 ENCOUNTER — Encounter: Payer: Self-pay | Admitting: Family Medicine

## 2019-02-21 DIAGNOSIS — I10 Essential (primary) hypertension: Secondary | ICD-10-CM | POA: Diagnosis not present

## 2019-02-21 DIAGNOSIS — Z01812 Encounter for preprocedural laboratory examination: Secondary | ICD-10-CM | POA: Diagnosis not present

## 2019-02-21 DIAGNOSIS — Z03818 Encounter for observation for suspected exposure to other biological agents ruled out: Secondary | ICD-10-CM | POA: Diagnosis not present

## 2019-02-21 NOTE — Progress Notes (Signed)
Patient ID: Kari Lopez, female    DOB: [birth year]  Age: 48 y.o. MRN: [id number]    Subjective:  Subjective  HPI Kari Lopez presents for f/u bp.  Pt feels great on the new meds She needs the form filled out for her procedure.  No complaints.    Review of Systems  Constitutional: Negative for appetite change, diaphoresis, fatigue and unexpected weight change.  Eyes: Negative for pain, redness and visual disturbance.  Respiratory: Negative for cough, chest tightness, shortness of breath and wheezing.   Cardiovascular: Negative for chest pain, palpitations and leg swelling.  Endocrine: Negative for cold intolerance, heat intolerance, polydipsia, polyphagia and polyuria.  Genitourinary: Negative for difficulty urinating, dysuria and frequency.  Neurological: Negative for dizziness, light-headedness, numbness and headaches.    History Past Medical History:  Diagnosis Date  . Depression     She has a past surgical history that includes Carpal tunnel release.   Her family history includes Cancer in an other family member; Coronary artery disease in an other family member; Diabetes in an other family member; Hyperlipidemia in an other family member; Hypertension in an other family member.She reports that she has never smoked. She has never used smokeless tobacco. She reports current alcohol use. She reports that she does not use drugs.  Current Outpatient Medications on File Prior to Visit  Medication Sig Dispense Refill  . DRYSOL 20 % external solution APPLY TOPICALLY at bedtime 35 mL 2  . escitalopram (LEXAPRO) 10 MG tablet Take 1 tablet (10 mg total) by mouth daily. 30 tablet 2  . levonorgestrel (MIRENA) 20 MCG/24HR IUD 1 Intra Uterine Device (1 each total) by Intrauterine route once. 1 each 0  . losartan (COZAAR) 50 MG tablet Take 1 tablet (50 mg total) by mouth daily. 30 tablet 3  . omeprazole (PRILOSEC) 20 MG capsule TAKE 1 CAPSULE BY MOUTH  ONCE DAILY 90 capsule 1  .  sertraline (ZOLOFT) 100 MG tablet TAKE 1 AND 1/2 TABLETS BY  MOUTH DAILY (Patient not taking: Reported on 02/21/2019) 45 tablet 0   No current facility-administered medications on file prior to visit.      Objective:  Objective  Physical Exam Vitals signs and nursing note reviewed.  Constitutional:      Appearance: She is well-developed.  HENT:     Head: Normocephalic and atraumatic.  Eyes:     Conjunctiva/sclera: Conjunctivae normal.  Neck:     Musculoskeletal: Normal range of motion and neck supple.     Thyroid: No thyromegaly.     Vascular: No carotid bruit or JVD.  Cardiovascular:     Rate and Rhythm: Normal rate and regular rhythm.     Heart sounds: Normal heart sounds. No murmur.  Pulmonary:     Effort: Pulmonary effort is normal. No respiratory distress.     Breath sounds: Normal breath sounds. No wheezing or rales.  Chest:     Chest wall: No tenderness.  Neurological:     Mental Status: She is alert and oriented to person, place, and time.    BP 138/80 (BP Location: Right Arm, Patient Position: Sitting, Cuff Size: Normal)   Pulse 87   Temp (!) 97.5 F (36.4 C) (Temporal)   Resp 18   Ht 5\' 8"  (1.727 m)   Wt 201 lb 9.6 oz (91.4 kg)   SpO2 97%   BMI 30.65 kg/m  Wt Readings from Last 3 Encounters:  02/21/19 201 lb 9.6 oz (91.4 kg)  02/13/19 203 lb 6.4  oz (92.3 kg)  05/13/17 187 lb 12.8 oz (85.2 kg)     Lab Results  Component Value Date   WBC 7.2 03/26/2015   HGB 14.1 03/26/2015   HCT 41.2 03/26/2015   PLT 269.0 03/26/2015   GLUCOSE 89 03/26/2015   CHOL 212 (H) 03/26/2015   TRIG 93.0 03/26/2015   HDL 61.40 03/26/2015   LDLDIRECT 184.0 02/06/2013   LDLCALC 132 (H) 03/26/2015   ALT 18 03/26/2015   ALT 18 03/26/2015   AST 17 03/26/2015   AST 17 03/26/2015   NA 140 03/26/2015   K 4.0 03/26/2015   CL 105 03/26/2015   CREATININE 0.64 03/26/2015   BUN 12 03/26/2015   CO2 28 03/26/2015   TSH 3.51 03/26/2015    [place] Abdomen Limited  Result Date:  08/11/2016 CLINICAL DATA:  Palpable mass just to the left of the umbilicus. EXAM: US ABDOMEN LIMITED - left mid abdomen COMPARISON:  None. FINDINGS: Soft tissue scanning of the abdominal wall was performed where the patient felt a small lump. This was about 6 cm to the left of the umbilicus. The area involve to looks like normal skin, subcutaneous tissue and subcutaneous fat. There is no sign of a cyst or definable mass. IMPRESSION: Negative scan of the area of concern. No definable structure, only normal appearing tissues. Certainly, subcutaneous fat can be lobular. Electronically Signed   By: Paulina FusiMark  Shogry M.D.   On: 08/11/2016 15:32     Assessment & Plan:  Plan  I am having Kari Lopez maintain her levonorgestrel, Drysol, omeprazole, sertraline, escitalopram, and losartan.  No orders of the defined types were placed in this encounter.   Problem List Items Addressed This Visit      Unprioritized   HTN (hypertension)    Well controlled, no changes to meds. Encouraged heart healthy diet such as the DASH diet and exercise as tolerated.          Follow-up: Return in about 3 months (around 05/24/2019), or if symptoms worsen or fail to improve.  Donato SchultzYvonne R Lowne Chase, DO

## 2019-02-21 NOTE — Patient Instructions (Signed)

## 2019-02-21 NOTE — Assessment & Plan Note (Signed)
Well controlled, no changes to meds. Encouraged heart healthy diet such as the DASH diet and exercise as tolerated.  °

## 2019-06-28 ENCOUNTER — Telehealth: Payer: Self-pay | Admitting: *Deleted

## 2019-06-28 DIAGNOSIS — I10 Essential (primary) hypertension: Secondary | ICD-10-CM

## 2019-06-28 MED ORDER — LOSARTAN POTASSIUM 50 MG PO TABS: 50.0000 mg | ORAL_TABLET | Freq: Every day | ORAL | 0 refills | Status: DC

## 2019-06-28 NOTE — Telephone Encounter (Signed)
Received losartan request form [facility]. 30 day supply sent. Pt past due for follow up (04/2019). Mychart message sent to schedule virtual visit for further refills.

## 2019-07-04 ENCOUNTER — Encounter: Payer: Self-pay | Admitting: Family Medicine

## 2019-07-05 NOTE — Telephone Encounter (Signed)
Did [facility] set up f/u with neurosugeon ?  She can try miralax otc  But if he is not eating or drinking on his own they may need to take him back to [facility] ----but call specialist first ----I can not tell in care everywhere if appointment was set up

## 2019-09-02 ENCOUNTER — Other Ambulatory Visit: Payer: Self-pay | Admitting: Family Medicine

## 2019-09-02 DIAGNOSIS — I10 Essential (primary) hypertension: Secondary | ICD-10-CM

## 2019-09-04 MED ORDER — LOSARTAN POTASSIUM 50 MG PO TABS: 50.0000 mg | ORAL_TABLET | Freq: Every day | ORAL | 0 refills | Status: DC

## 2019-10-02 ENCOUNTER — Encounter: Payer: Self-pay | Admitting: Family Medicine

## 2019-10-04 ENCOUNTER — Other Ambulatory Visit: Payer: Self-pay

## 2019-10-05 ENCOUNTER — Ambulatory Visit (INDEPENDENT_AMBULATORY_CARE_PROVIDER_SITE_OTHER): Payer: BC Managed Care – PPO | Admitting: Family Medicine

## 2019-10-05 ENCOUNTER — Encounter: Payer: Self-pay | Admitting: Family Medicine

## 2019-10-05 ENCOUNTER — Other Ambulatory Visit: Payer: Self-pay

## 2019-10-05 VITALS — BP 120/90 | HR 89 | Temp 97.9°F | Resp 18 | Ht 68.0 in | Wt 209.6 lb

## 2019-10-05 DIAGNOSIS — K219 Gastro-esophageal reflux disease without esophagitis: Secondary | ICD-10-CM | POA: Diagnosis not present

## 2019-10-05 DIAGNOSIS — F320 Major depressive disorder, single episode, mild: Secondary | ICD-10-CM | POA: Insufficient documentation

## 2019-10-05 DIAGNOSIS — I10 Essential (primary) hypertension: Secondary | ICD-10-CM

## 2019-10-05 DIAGNOSIS — R232 Flushing: Secondary | ICD-10-CM | POA: Diagnosis not present

## 2019-10-05 DIAGNOSIS — E785 Hyperlipidemia, unspecified: Secondary | ICD-10-CM

## 2019-10-05 DIAGNOSIS — M25561 Pain in right knee: Secondary | ICD-10-CM

## 2019-10-05 DIAGNOSIS — M25562 Pain in left knee: Secondary | ICD-10-CM

## 2019-10-05 DIAGNOSIS — G8929 Other chronic pain: Secondary | ICD-10-CM | POA: Insufficient documentation

## 2019-10-05 LAB — LDL CHOLESTEROL, DIRECT: Direct LDL: 132 mg/dL

## 2019-10-05 LAB — LIPID PANEL
Cholesterol: 243 mg/dL — ABNORMAL HIGH (ref 0–200)
HDL: 55.6 mg/dL (ref >39.00)
Total CHOL/HDL Ratio: 4
Triglycerides: 404 mg/dL — ABNORMAL HIGH (ref 0.0–149.0)

## 2019-10-05 LAB — COMPREHENSIVE METABOLIC PANEL
ALT: 35 U/L (ref 0–35)
AST: 27 U/L (ref 0–37)
Albumin: 4.6 g/dL (ref 3.5–5.2)
Alkaline Phosphatase: 59 U/L (ref 39–117)
BUN: 17 mg/dL (ref 6–23)
CO2: 26 mEq/L (ref 19–32)
Calcium: 8.8 mg/dL (ref 8.4–10.5)
Chloride: 102 mEq/L (ref 96–112)
Creatinine, Ser: 0.67 mg/dL (ref 0.40–1.20)
GFR: 93.35 mL/min (ref >60.00)
Glucose, Bld: 101 mg/dL — ABNORMAL HIGH (ref 70–99)
Potassium: 3.9 mEq/L (ref 3.5–5.1)
Sodium: 139 mEq/L (ref 135–145)
Total Bilirubin: 0.4 mg/dL (ref 0.2–1.2)
Total Protein: 6.7 g/dL (ref 6.0–8.3)

## 2019-10-05 MED ORDER — OMEPRAZOLE 20 MG PO CPDR: 20.0000 mg | DELAYED_RELEASE_CAPSULE | Freq: Every day | ORAL | 1 refills | Status: DC

## 2019-10-05 MED ORDER — CELECOXIB 100 MG PO CAPS: 100.0000 mg | ORAL_CAPSULE | Freq: Every day | ORAL | 2 refills | Status: DC

## 2019-10-05 MED ORDER — LOSARTAN POTASSIUM 100 MG PO TABS: 100.0000 mg | ORAL_TABLET | Freq: Every day | ORAL | 1 refills | Status: DC

## 2019-10-05 MED ORDER — ESCITALOPRAM OXALATE 10 MG PO TABS: 10.0000 mg | ORAL_TABLET | Freq: Every day | ORAL | 1 refills | Status: DC

## 2019-10-05 NOTE — Patient Instructions (Signed)
DASH Eating Plan DASH stands for "Dietary Approaches to Stop Hypertension." The DASH eating plan is a healthy eating plan that has been shown to reduce high blood pressure (hypertension). It may also reduce your risk for type 2 diabetes, heart disease, and stroke. The DASH eating plan may also help with weight loss. What are tips for following this plan?  General guidelines  Avoid eating more than 2,300 mg (milligrams) of salt (sodium) a day. If you have hypertension, you may need to reduce your sodium intake to 1,500 mg a day.  Limit alcohol intake to no more than 1 drink a day for nonpregnant women and 2 drinks a day for men. One drink equals 12 oz of beer, 5 oz of wine, or 1 oz of hard liquor.  Work with your health care provider to maintain a healthy body weight or to lose weight. Ask what an ideal weight is for you.  Get at least 30 minutes of exercise that causes your heart to beat faster (aerobic exercise) most days of the week. Activities may include walking, swimming, or biking.  Work with your health care provider or diet and nutrition specialist (dietitian) to adjust your eating plan to your individual calorie needs. Reading food labels   Check food labels for the amount of sodium per serving. Choose foods with less than 5 percent of the Daily Value of sodium. Generally, foods with less than 300 mg of sodium per serving fit into this eating plan.  To find whole grains, look for the word "whole" as the first word in the ingredient list. Shopping  Buy products labeled as "low-sodium" or "no salt added."  Buy fresh foods. Avoid canned foods and premade or frozen meals. Cooking  Avoid adding salt when cooking. Use salt-free seasonings or herbs instead of table salt or sea salt. Check with your health care provider or pharmacist before using salt substitutes.  Do not fry foods. Cook foods using healthy methods such as baking, boiling, grilling, and broiling instead.  Cook with  heart-healthy oils, such as olive, canola, soybean, or sunflower oil. Meal planning  Eat a balanced diet that includes: ? 5 or more servings of fruits and vegetables each day. At each meal, try to fill half of your plate with fruits and vegetables. ? Up to 6-8 servings of whole grains each day. ? Less than 6 oz of lean meat, poultry, or fish each day. A 3-oz serving of meat is about the same size as a deck of cards. One egg equals 1 oz. ? 2 servings of low-fat dairy each day. ? A serving of nuts, seeds, or beans 5 times each week. ? Heart-healthy fats. Healthy fats called Omega-3 fatty acids are found in foods such as flaxseeds and coldwater fish, like sardines, salmon, and mackerel.  Limit how much you eat of the following: ? Canned or prepackaged foods. ? Food that is high in trans fat, such as fried foods. ? Food that is high in saturated fat, such as fatty meat. ? Sweets, desserts, sugary drinks, and other foods with added sugar. ? Full-fat dairy products.  Do not salt foods before eating.  Try to eat at least 2 vegetarian meals each week.  Eat more home-cooked food and less restaurant, buffet, and fast food.  When eating at a restaurant, ask that your food be prepared with less salt or no salt, if possible. What foods are recommended? The items listed may not be a complete list. Talk with your dietitian about   what dietary choices are best for you. Grains Whole-grain or whole-wheat bread. Whole-grain or whole-wheat pasta. Brown rice. Oatmeal. Quinoa. Bulgur. Whole-grain and low-sodium cereals. Pita bread. Low-fat, low-sodium crackers. Whole-wheat flour tortillas. Vegetables Fresh or frozen vegetables (raw, steamed, roasted, or grilled). Low-sodium or reduced-sodium tomato and vegetable juice. Low-sodium or reduced-sodium tomato sauce and tomato paste. Low-sodium or reduced-sodium canned vegetables. Fruits All fresh, dried, or frozen fruit. Canned fruit in natural juice (without  added sugar). Meat and other protein foods Skinless chicken or turkey. Ground chicken or turkey. Pork with fat trimmed off. Fish and seafood. Egg whites. Dried beans, peas, or lentils. Unsalted nuts, nut butters, and seeds. Unsalted canned beans. Lean cuts of beef with fat trimmed off. Low-sodium, lean deli meat. Dairy Low-fat (1%) or fat-free (skim) milk. Fat-free, low-fat, or reduced-fat cheeses. Nonfat, low-sodium ricotta or cottage cheese. Low-fat or nonfat yogurt. Low-fat, low-sodium cheese. Fats and oils Soft margarine without trans fats. Vegetable oil. Low-fat, reduced-fat, or light mayonnaise and salad dressings (reduced-sodium). Canola, safflower, olive, soybean, and sunflower oils. Avocado. Seasoning and other foods Herbs. Spices. Seasoning mixes without salt. Unsalted popcorn and pretzels. Fat-free sweets. What foods are not recommended? The items listed may not be a complete list. Talk with your dietitian about what dietary choices are best for you. Grains Baked goods made with fat, such as croissants, muffins, or some breads. Dry pasta or rice meal packs. Vegetables Creamed or fried vegetables. Vegetables in a cheese sauce. Regular canned vegetables (not low-sodium or reduced-sodium). Regular canned tomato sauce and paste (not low-sodium or reduced-sodium). Regular tomato and vegetable juice (not low-sodium or reduced-sodium). Pickles. Olives. Fruits Canned fruit in a light or heavy syrup. Fried fruit. Fruit in cream or butter sauce. Meat and other protein foods Fatty cuts of meat. Ribs. Fried meat. Bacon. Sausage. Bologna and other processed lunch meats. Salami. Fatback. Hotdogs. Bratwurst. Salted nuts and seeds. Canned beans with added salt. Canned or smoked fish. Whole eggs or egg yolks. Chicken or turkey with skin. Dairy Whole or 2% milk, cream, and half-and-half. Whole or full-fat cream cheese. Whole-fat or sweetened yogurt. Full-fat cheese. Nondairy creamers. Whipped toppings.  Processed cheese and cheese spreads. Fats and oils Butter. Stick margarine. Lard. Shortening. Ghee. Bacon fat. Tropical oils, such as coconut, palm kernel, or palm oil. Seasoning and other foods Salted popcorn and pretzels. Onion salt, garlic salt, seasoned salt, table salt, and sea salt. Worcestershire sauce. Tartar sauce. Barbecue sauce. Teriyaki sauce. Soy sauce, including reduced-sodium. Steak sauce. Canned and packaged gravies. Fish sauce. Oyster sauce. Cocktail sauce. Horseradish that you find on the shelf. Ketchup. Mustard. Meat flavorings and tenderizers. Bouillon cubes. Hot sauce and Tabasco sauce. Premade or packaged marinades. Premade or packaged taco seasonings. Relishes. Regular salad dressings. Where to find more information:  National Heart, Lung, and Blood Institute: www.nhlbi.nih.gov  American Heart Association: www.heart.org Summary  The DASH eating plan is a healthy eating plan that has been shown to reduce high blood pressure (hypertension). It may also reduce your risk for type 2 diabetes, heart disease, and stroke.  With the DASH eating plan, you should limit salt (sodium) intake to 2,300 mg a day. If you have hypertension, you may need to reduce your sodium intake to 1,500 mg a day.  When on the DASH eating plan, aim to eat more fresh fruits and vegetables, whole grains, lean proteins, low-fat dairy, and heart-healthy fats.  Work with your health care provider or diet and nutrition specialist (dietitian) to adjust your eating plan to your   individual calorie needs. This information is not intended to replace advice given to you by your health care provider. Make sure you discuss any questions you have with your health care provider. Document Revised: 05/28/2017 Document Reviewed: 06/08/2016 Elsevier Patient Education  2020 Elsevier Inc.  

## 2019-10-05 NOTE — Assessment & Plan Note (Signed)
Poorly controlled will alter medications, encouraged DASH diet, minimize caffeine and obtain adequate sleep. Report concerning symptoms and follow up as directed and as needed 

## 2019-10-05 NOTE — Progress Notes (Signed)
Patient ID: Kari Lopez, female    DOB: 1971/05/18  Age: 49 y.o. MRN: [id number]    Subjective:  Subjective  HPI Kari Lopez presents for f/u bp and c/o chest turning red and bp being elevated at home  No cp or sob or palpitations Pt also c/o b/l knee pain -- she has seen [facility] in past and she feels like knees are worsening.  She tried her sisters celebrex which helped.   Review of Systems  Constitutional: Negative for appetite change, diaphoresis, fatigue and unexpected weight change.  Eyes: Negative for pain, redness and visual disturbance.  Respiratory: Negative for cough, chest tightness, shortness of breath and wheezing.   Cardiovascular: Negative for chest pain, palpitations and leg swelling.  Endocrine: Negative for cold intolerance, heat intolerance, polydipsia, polyphagia and polyuria.  Genitourinary: Negative for difficulty urinating, dysuria and frequency.  Musculoskeletal: Positive for arthralgias. Negative for gait problem.  Neurological: Negative for dizziness, light-headedness, numbness and headaches.    History Past Medical History:  Diagnosis Date  . Depression     She has a past surgical history that includes Carpal tunnel release.   Her family history includes Cancer in an other family member; Coronary artery disease in an other family member; Diabetes in an other family member; Hyperlipidemia in an other family member; Hypertension in an other family member.She reports that she has never smoked. She has never used smokeless tobacco. She reports current alcohol use. She reports that she does not use drugs.  Current Outpatient Medications on File Prior to Visit  Medication Sig Dispense Refill  . levonorgestrel (MIRENA) 20 MCG/24HR IUD 1 Intra Uterine Device (1 each total) by Intrauterine route once. 1 each 0  . sertraline (ZOLOFT) 100 MG tablet TAKE 1 AND 1/2 TABLETS BY  MOUTH DAILY 45 tablet 0  . DRYSOL 20 % external solution APPLY TOPICALLY at bedtime  (Patient not taking: Reported on 10/05/2019) 35 mL 2   No current facility-administered medications on file prior to visit.     Objective:  Objective  Physical Exam Vitals and nursing note reviewed.  Constitutional:      Appearance: She is well-developed.  HENT:     Head: Normocephalic and atraumatic.  Eyes:     Conjunctiva/sclera: Conjunctivae normal.  Neck:     Thyroid: No thyromegaly.     Vascular: No carotid bruit or JVD.  Cardiovascular:     Rate and Rhythm: Normal rate and regular rhythm.     Heart sounds: Normal heart sounds. No murmur.  Pulmonary:     Effort: Pulmonary effort is normal. No respiratory distress.     Breath sounds: Normal breath sounds. No wheezing or rales.  Chest:     Chest wall: No tenderness.  Musculoskeletal:        General: Swelling and tenderness present. No deformity.     Cervical back: Normal range of motion and neck supple.  Neurological:     Mental Status: She is alert and oriented to person, place, and time.    BP 120/90 (BP Location: Right Arm, Patient Position: Sitting, Cuff Size: Normal)   Pulse 89   Temp 97.9 F (36.6 C) (Temporal)   Resp 18   Ht 5\' 8"  (1.727 m)   Wt 209 lb 9.6 oz (95.1 kg)   SpO2 96%   BMI 31.87 kg/m  Wt Readings from Last 3 Encounters:  10/05/19 209 lb 9.6 oz (95.1 kg)  02/21/19 201 lb 9.6 oz (91.4 kg)  02/13/19 203 lb  6.4 oz (92.3 kg)     Lab Results  Component Value Date   WBC 7.2 03/26/2015   HGB 14.1 03/26/2015   HCT 41.2 03/26/2015   PLT 269.0 03/26/2015   GLUCOSE 89 03/26/2015   CHOL 212 (H) 03/26/2015   TRIG 93.0 03/26/2015   HDL 61.40 03/26/2015   LDLDIRECT 184.0 02/06/2013   LDLCALC 132 (H) 03/26/2015   ALT 18 03/26/2015   ALT 18 03/26/2015   AST 17 03/26/2015   AST 17 03/26/2015   NA 140 03/26/2015   K 4.0 03/26/2015   CL 105 03/26/2015   CREATININE 0.64 03/26/2015   BUN 12 03/26/2015   CO2 28 03/26/2015   TSH 3.51 03/26/2015    US Abdomen Limited  Result Date:  08/11/2016 CLINICAL DATA:  Palpable mass just to the left of the umbilicus. EXAM: US ABDOMEN LIMITED - left mid abdomen COMPARISON:  None. FINDINGS: Soft tissue scanning of the abdominal wall was performed where the patient felt a small lump. This was about 6 cm to the left of the umbilicus. The area involve to looks like normal skin, subcutaneous tissue and subcutaneous fat. There is no sign of a cyst or definable mass. IMPRESSION: Negative scan of the area of concern. No definable structure, only normal appearing tissues. Certainly, subcutaneous fat can be lobular. Electronically Signed   By: Nelson Chimes M.D.   On: 08/11/2016 15:32     Assessment & Plan:  Plan  I have discontinued Amadi L. Wernette's losartan. I have also changed her omeprazole. Additionally, I am having her start on losartan and celecoxib. Lastly, I am having her maintain her levonorgestrel, Drysol, sertraline, and escitalopram.  Meds ordered this encounter  Medications  . escitalopram (LEXAPRO) 10 MG tablet    Sig: Take 1 tablet (10 mg total) by mouth daily.    Dispense:  90 tablet    Refill:  1  . omeprazole (PRILOSEC) 20 MG capsule    Sig: Take 1 capsule (20 mg total) by mouth daily.    Dispense:  90 capsule    Refill:  1  . losartan (COZAAR) 100 MG tablet    Sig: Take 1 tablet (100 mg total) by mouth daily.    Dispense:  30 tablet    Refill:  1  . celecoxib (CELEBREX) 100 MG capsule    Sig: Take 1 capsule (100 mg total) by mouth daily.    Dispense:  30 capsule    Refill:  2    Problem List Items Addressed This Visit      Unprioritized   GERD (gastroesophageal reflux disease)   Relevant Medications   omeprazole (PRILOSEC) 20 MG capsule   HTN (hypertension) - Primary    Poorly controlled will alter medications, encouraged DASH diet, minimize caffeine and obtain adequate sleep. Report concerning symptoms and follow up as directed and as needed      Relevant Medications   losartan (COZAAR) 100 MG tablet    Other Relevant Orders   Lipid panel   Comprehensive metabolic panel    Other Visit Diagnoses    Depression, major, single episode, mild (HCC)       Relevant Medications   escitalopram (LEXAPRO) 10 MG tablet   Hot flashes       Relevant Medications   escitalopram (LEXAPRO) 10 MG tablet   losartan (COZAAR) 100 MG tablet   Chronic pain of both knees       Relevant Medications   escitalopram (LEXAPRO) 10 MG tablet  celecoxib (CELEBREX) 100 MG capsule   Other Relevant Orders   Ambulatory referral to Orthopedic Surgery      Follow-up: Return in about 6 months (around 04/05/2020), or if symptoms worsen or fail to improve, for annual exam, fasting.  Donato Schultz, DO

## 2019-10-05 NOTE — Assessment & Plan Note (Signed)
Encouraged heart healthy diet, increase exercise, avoid trans fats, consider a krill oil cap daily 

## 2019-10-05 NOTE — Assessment & Plan Note (Signed)
prob OA--- hx surgery Refer to ortho  celebrex 100 mg qd

## 2019-10-05 NOTE — Assessment & Plan Note (Signed)
Stable. Refill meds

## 2019-10-08 ENCOUNTER — Other Ambulatory Visit: Payer: Self-pay | Admitting: Family Medicine

## 2019-10-08 DIAGNOSIS — E785 Hyperlipidemia, unspecified: Secondary | ICD-10-CM

## 2019-10-10 ENCOUNTER — Telehealth: Payer: Self-pay

## 2019-10-10 NOTE — Telephone Encounter (Signed)
LM requesting call back.  

## 2019-10-10 NOTE — Telephone Encounter (Signed)
-----   Message from Donato Schultz, DO sent at 10/08/2019 11:50 AM EDT ----- Cholesterol--- LDL goal < 100,  HDL >50,  TG < 150.  Diet and exercise will increase HDL and decrease LDL and TG.  Fish,  Fish Oil, Flaxseed oil will also help increase the HDL and decrease Triglycerides.   Recheck labs in 3 months Start fenofibrate 160 mg #30  1 po qd, 2 refills .

## 2019-11-01 ENCOUNTER — Encounter: Payer: Self-pay | Admitting: Family Medicine

## 2019-11-28 DIAGNOSIS — M25562 Pain in left knee: Secondary | ICD-10-CM | POA: Diagnosis not present

## 2019-11-28 DIAGNOSIS — M170 Bilateral primary osteoarthritis of knee: Secondary | ICD-10-CM | POA: Diagnosis not present

## 2019-11-28 DIAGNOSIS — M25561 Pain in right knee: Secondary | ICD-10-CM | POA: Diagnosis not present

## 2019-12-06 ENCOUNTER — Other Ambulatory Visit: Payer: Self-pay

## 2019-12-06 DIAGNOSIS — I10 Essential (primary) hypertension: Secondary | ICD-10-CM

## 2019-12-06 MED ORDER — LOSARTAN POTASSIUM 100 MG PO TABS: 100.0000 mg | ORAL_TABLET | Freq: Every day | ORAL | 1 refills | Status: DC

## 2020-03-05 ENCOUNTER — Encounter: Payer: Self-pay | Admitting: Family Medicine

## 2020-03-26 ENCOUNTER — Other Ambulatory Visit: Payer: Self-pay | Admitting: Family Medicine

## 2020-03-26 DIAGNOSIS — K219 Gastro-esophageal reflux disease without esophagitis: Secondary | ICD-10-CM

## 2020-03-27 ENCOUNTER — Other Ambulatory Visit: Payer: Self-pay | Admitting: Family Medicine

## 2020-03-27 DIAGNOSIS — F320 Major depressive disorder, single episode, mild: Secondary | ICD-10-CM

## 2020-03-27 DIAGNOSIS — R232 Flushing: Secondary | ICD-10-CM

## 2020-04-24 ENCOUNTER — Other Ambulatory Visit: Payer: Self-pay | Admitting: Family Medicine

## 2020-04-24 DIAGNOSIS — F320 Major depressive disorder, single episode, mild: Secondary | ICD-10-CM

## 2020-04-24 DIAGNOSIS — R232 Flushing: Secondary | ICD-10-CM

## 2020-04-24 NOTE — Telephone Encounter (Signed)
Requesting: lexapro Contract:n/a UDS:n/a Last Visit:10/05/19 Next Visit:n/a Last Refill:03/27/20  Please Advise

## 2020-04-25 ENCOUNTER — Other Ambulatory Visit: Payer: Self-pay | Admitting: Family Medicine

## 2020-04-25 DIAGNOSIS — F320 Major depressive disorder, single episode, mild: Secondary | ICD-10-CM

## 2020-04-25 DIAGNOSIS — R232 Flushing: Secondary | ICD-10-CM

## 2020-04-25 MED ORDER — ESCITALOPRAM OXALATE 10 MG PO TABS: 10.0000 mg | ORAL_TABLET | Freq: Every day | ORAL | 0 refills | Status: DC

## 2020-05-24 ENCOUNTER — Other Ambulatory Visit: Payer: Self-pay | Admitting: Family Medicine

## 2020-05-24 DIAGNOSIS — R232 Flushing: Secondary | ICD-10-CM

## 2020-05-24 DIAGNOSIS — F320 Major depressive disorder, single episode, mild: Secondary | ICD-10-CM

## 2020-07-03 ENCOUNTER — Other Ambulatory Visit: Payer: Self-pay | Admitting: Family Medicine

## 2020-07-03 DIAGNOSIS — F320 Major depressive disorder, single episode, mild: Secondary | ICD-10-CM

## 2020-07-03 DIAGNOSIS — R232 Flushing: Secondary | ICD-10-CM

## 2020-08-09 ENCOUNTER — Other Ambulatory Visit: Payer: Self-pay | Admitting: Family Medicine

## 2020-08-09 DIAGNOSIS — F320 Major depressive disorder, single episode, mild: Secondary | ICD-10-CM

## 2020-08-09 DIAGNOSIS — R232 Flushing: Secondary | ICD-10-CM

## 2020-08-10 MED ORDER — ESCITALOPRAM OXALATE 10 MG PO TABS: 10.0000 mg | ORAL_TABLET | Freq: Every day | ORAL | 0 refills | Status: DC

## 2020-09-06 ENCOUNTER — Other Ambulatory Visit: Payer: Self-pay | Admitting: Family Medicine

## 2020-09-06 DIAGNOSIS — R232 Flushing: Secondary | ICD-10-CM

## 2020-09-06 DIAGNOSIS — F320 Major depressive disorder, single episode, mild: Secondary | ICD-10-CM

## 2020-09-06 MED ORDER — ESCITALOPRAM OXALATE 10 MG PO TABS: 10.0000 mg | ORAL_TABLET | Freq: Every day | ORAL | 0 refills | Status: DC

## 2020-12-06 ENCOUNTER — Other Ambulatory Visit: Payer: Self-pay

## 2020-12-06 ENCOUNTER — Telehealth: Payer: Self-pay | Admitting: Family Medicine

## 2020-12-06 DIAGNOSIS — I10 Essential (primary) hypertension: Secondary | ICD-10-CM

## 2020-12-06 MED ORDER — LOSARTAN POTASSIUM 100 MG PO TABS: 100.0000 mg | ORAL_TABLET | Freq: Every day | ORAL | 0 refills | Status: DC

## 2020-12-06 NOTE — Telephone Encounter (Signed)
Medication:losartan (COZAAR) 50 MG tablet   Has the patient contacted their pharmacy? Yes.   (If no, request that the patient contact the pharmacy for the refill.) (If yes, when and what did the pharmacy advise?)  Preferred Pharmacy (with phone number or street name):  [id number], [place] - [address] AT [facility] [address]  [address], [place] [address]  Phone:  [phone]  Fax:  [phone]  Agent: Please be advised that RX refills may take up to 3 business days. We ask that you follow-up with your pharmacy.   Future appt has been made for patient

## 2020-12-06 NOTE — Telephone Encounter (Signed)
Refilled for 30 days 

## 2020-12-25 ENCOUNTER — Other Ambulatory Visit: Payer: Self-pay

## 2020-12-26 ENCOUNTER — Encounter: Payer: Self-pay | Admitting: Family Medicine

## 2020-12-26 ENCOUNTER — Ambulatory Visit (INDEPENDENT_AMBULATORY_CARE_PROVIDER_SITE_OTHER): Payer: BC Managed Care – PPO | Admitting: Family Medicine

## 2020-12-26 VITALS — BP 126/84 | HR 68 | Temp 98.6°F | Resp 18 | Ht 68.0 in | Wt 207.8 lb

## 2020-12-26 DIAGNOSIS — I10 Essential (primary) hypertension: Secondary | ICD-10-CM | POA: Diagnosis not present

## 2020-12-26 DIAGNOSIS — Z23 Encounter for immunization: Secondary | ICD-10-CM

## 2020-12-26 DIAGNOSIS — F320 Major depressive disorder, single episode, mild: Secondary | ICD-10-CM | POA: Diagnosis not present

## 2020-12-26 DIAGNOSIS — R232 Flushing: Secondary | ICD-10-CM

## 2020-12-26 DIAGNOSIS — K219 Gastro-esophageal reflux disease without esophagitis: Secondary | ICD-10-CM

## 2020-12-26 DIAGNOSIS — Z1159 Encounter for screening for other viral diseases: Secondary | ICD-10-CM

## 2020-12-26 DIAGNOSIS — Z1231 Encounter for screening mammogram for malignant neoplasm of breast: Secondary | ICD-10-CM

## 2020-12-26 DIAGNOSIS — Z0000 Encounter for general adult medical examination without abnormal findings: Secondary | ICD-10-CM

## 2020-12-26 DIAGNOSIS — E785 Hyperlipidemia, unspecified: Secondary | ICD-10-CM

## 2020-12-26 DIAGNOSIS — Z01419 Encounter for gynecological examination (general) (routine) without abnormal findings: Secondary | ICD-10-CM

## 2020-12-26 DIAGNOSIS — Z1211 Encounter for screening for malignant neoplasm of colon: Secondary | ICD-10-CM

## 2020-12-26 LAB — COMPREHENSIVE METABOLIC PANEL
ALT: 47 U/L — ABNORMAL HIGH (ref 0–35)
AST: 27 U/L (ref 0–37)
Albumin: 4.8 g/dL (ref 3.5–5.2)
Alkaline Phosphatase: 60 U/L (ref 39–117)
BUN: 14 mg/dL (ref 6–23)
CO2: 28 mEq/L (ref 19–32)
Calcium: 9.7 mg/dL (ref 8.4–10.5)
Chloride: 101 mEq/L (ref 96–112)
Creatinine, Ser: 0.67 mg/dL (ref 0.40–1.20)
GFR: 101.68 mL/min (ref >60.00)
Glucose, Bld: 99 mg/dL (ref 70–99)
Potassium: 4.4 mEq/L (ref 3.5–5.1)
Sodium: 138 mEq/L (ref 135–145)
Total Bilirubin: 0.6 mg/dL (ref 0.2–1.2)
Total Protein: 7.2 g/dL (ref 6.0–8.3)

## 2020-12-26 LAB — TSH: TSH: 2.83 u[IU]/mL (ref 0.35–5.50)

## 2020-12-26 LAB — CBC WITH DIFFERENTIAL/PLATELET
Basophils Absolute: 0 10*3/uL (ref 0.0–0.1)
Basophils Relative: 0.6 % (ref 0.0–3.0)
Eosinophils Absolute: 0.2 10*3/uL (ref 0.0–0.7)
Eosinophils Relative: 2.7 % (ref 0.0–5.0)
HCT: 43.8 % (ref 36.0–46.0)
Hemoglobin: 15.1 g/dL — ABNORMAL HIGH (ref 12.0–15.0)
Lymphocytes Relative: 28 % (ref 12.0–46.0)
Lymphs Abs: 2.1 10*3/uL (ref 0.7–4.0)
MCHC: 34.5 g/dL (ref 30.0–36.0)
MCV: 90.2 fl (ref 78.0–100.0)
Monocytes Absolute: 0.7 10*3/uL (ref 0.1–1.0)
Monocytes Relative: 9.9 % (ref 3.0–12.0)
Neutro Abs: 4.4 10*3/uL (ref 1.4–7.7)
Neutrophils Relative %: 58.8 % (ref 43.0–77.0)
Platelets: 287 10*3/uL (ref 150.0–400.0)
RBC: 4.85 Mil/uL (ref 3.87–5.11)
RDW: 12.6 % (ref 11.5–15.5)
WBC: 7.5 10*3/uL (ref 4.0–10.5)

## 2020-12-26 LAB — LIPID PANEL
Cholesterol: 271 mg/dL — ABNORMAL HIGH (ref 0–200)
HDL: 61.1 mg/dL (ref >39.00)
LDL Cholesterol: 181 mg/dL — ABNORMAL HIGH (ref 0–99)
NonHDL: 210.01
Total CHOL/HDL Ratio: 4
Triglycerides: 147 mg/dL (ref 0.0–149.0)
VLDL: 29.4 mg/dL (ref 0.0–40.0)

## 2020-12-26 MED ORDER — LOSARTAN POTASSIUM 100 MG PO TABS: 100.0000 mg | ORAL_TABLET | Freq: Every day | ORAL | 1 refills | Status: DC

## 2020-12-26 MED ORDER — OMEPRAZOLE 20 MG PO CPDR: 20.0000 mg | DELAYED_RELEASE_CAPSULE | Freq: Every day | ORAL | 3 refills | Status: DC

## 2020-12-26 MED ORDER — ESCITALOPRAM OXALATE 10 MG PO TABS: 10.0000 mg | ORAL_TABLET | Freq: Every day | ORAL | 3 refills | Status: DC

## 2020-12-26 NOTE — Assessment & Plan Note (Signed)
Well controlled, no changes to meds. Encouraged heart healthy diet such as the DASH diet and exercise as tolerated.  °

## 2020-12-26 NOTE — Patient Instructions (Signed)
Preventive Care 51-51 Years Old, Female Preventive care refers to lifestyle choices and visits with your health care provider that can promote health and wellness. This includes: A yearly physical exam. This is also called an annual wellness visit. Regular dental and eye exams. Immunizations. Screening for certain conditions. Healthy lifestyle choices, such as: Eating a healthy diet. Getting regular exercise. Not using drugs or products that contain nicotine and tobacco. Limiting alcohol use. What can I expect for my preventive care visit? Physical exam Your health care provider will check your: Height and weight. These may be used to calculate your BMI (body mass index). BMI is a measurement that tells if you are at a healthy weight. Heart rate and blood pressure. Body temperature. Skin for abnormal spots. Counseling Your health care provider may ask you questions about your: Past medical problems. Family's medical history. Alcohol, tobacco, and drug use. Emotional well-being. Home life and relationship well-being. Sexual activity. Diet, exercise, and sleep habits. Work and work Statistician. Access to firearms. Method of birth control. Menstrual cycle. Pregnancy history. What immunizations do I need?  Vaccines are usually given at various ages, according to a schedule. Your health care provider will recommend vaccines for you based on your age, medicalhistory, and lifestyle or other factors, such as travel or where you work. What tests do I need? Blood tests Lipid and cholesterol levels. These may be checked every 5 years, or more often if you are over 51 years old. Hepatitis C test. Hepatitis B test. Screening Lung cancer screening. You may have this screening every year starting at age 51 if you have a 30-pack-year history of smoking and currently smoke or have quit within the past 15 years. Colorectal cancer screening. All adults should have this screening starting at  age 51 and continuing until age 51. Your health care provider may recommend screening at age 51 if you are at increased risk. You will have tests every 1-10 years, depending on your results and the type of screening test. Diabetes screening. This is done by checking your blood sugar (glucose) after you have not eaten for a while (fasting). You may have this done every 1-3 years. Mammogram. This may be done every 1-2 years. Talk with your health care provider about when you should start having regular mammograms. This may depend on whether you have a family history of breast cancer. BRCA-related cancer screening. This may be done if you have a family history of breast, ovarian, tubal, or peritoneal cancers. Pelvic exam and Pap test. This may be done every 3 years starting at age 51. Starting at age 51, this may be done every 5 years if you have a Pap test in combination with an HPV test. Other tests STD (sexually transmitted disease) testing, if you are at risk. Bone density scan. This is done to screen for osteoporosis. You may have this scan if you are at high risk for osteoporosis. Talk with your health care provider about your test results, treatment options,and if necessary, the need for more tests. Follow these instructions at home: Eating and drinking  Eat a diet that includes fresh fruits and vegetables, whole grains, lean protein, and low-fat dairy products. Take vitamin and mineral supplements as recommended by your health care provider. Do not drink alcohol if: Your health care provider tells you not to drink. You are pregnant, may be pregnant, or are planning to become pregnant. If you drink alcohol: Limit how much you have to 0-1 drink a day. Be aware  of how much alcohol is in your drink. In the [place] one drink equals one 12 oz bottle of beer (355 mL), one 5 oz glass of wine (148 mL), or one 1 oz glass of hard liquor (44 mL).  Lifestyle Take daily care of your teeth and  gums. Brush your teeth every morning and night with fluoride toothpaste. Floss one time each day. Stay active. Exercise for at least 30 minutes 5 or more days each week. Do not use any products that contain nicotine or tobacco, such as cigarettes, e-cigarettes, and chewing tobacco. If you need help quitting, ask your health care provider. Do not use drugs. If you are sexually active, practice safe sex. Use a condom or other form of protection to prevent STIs (sexually transmitted infections). If you do not wish to become pregnant, use a form of birth control. If you plan to become pregnant, see your health care provider for a prepregnancy visit. If told by your health care provider, take low-dose aspirin daily starting at age 51. Find healthy ways to cope with stress, such as: Meditation, yoga, or listening to music. Journaling. Talking to a trusted person. Spending time with friends and family. Safety Always wear your seat belt while driving or riding in a vehicle. Do not drive: If you have been drinking alcohol. Do not ride with someone who has been drinking. When you are tired or distracted. While texting. Wear a helmet and other protective equipment during sports activities. If you have firearms in your house, make sure you follow all gun safety procedures. What's next? Visit your health care provider once a year for an annual wellness visit. Ask your health care provider how often you should have your eyes and teeth checked. Stay up to date on all vaccines. This information is not intended to replace advice given to you by your health care provider. Make sure you discuss any questions you have with your healthcare provider. Document Revised: 03/19/2020 Document Reviewed: 02/24/2018 Elsevier Patient Education  2022 [employer].

## 2020-12-26 NOTE — Progress Notes (Signed)
Subjective:     Kari Lopez is a 50 y.o. female and is here for a comprehensive physical exam. The patient reports no problems.  Social History   Socioeconomic History   Marital status: Married    Spouse name: Loraine Leriche   Number of children: Not on file   Years of education: Not on file   Highest education level: Not on file  Occupational History    Employer: [employer]  Tobacco Use   Smoking status: Never   Smokeless tobacco: Never  Substance and Sexual Activity   Alcohol use: Yes    Comment: rarely   Drug use: No   Sexual activity: Yes    Partners: Male  Other Topics Concern   Not on file  Social History Narrative   Exercise--- gym ,  3x a week   Social Determinants of Health   Financial Resource Strain: Not on file  Food Insecurity: Not on file  Transportation Needs: Not on file  Physical Activity: Not on file  Stress: Not on file  Social Connections: Not on file  Intimate Partner Violence: Not on file   Health Maintenance  Topic Date Due   Hepatitis C Screening  Never done   COLONOSCOPY (Pts 45-40yrs Insurance coverage will need to be confirmed)  Never done   MAMMOGRAM  09/19/2015   PAP SMEAR-Modifier  07/07/2016   Zoster Vaccines- Shingrix (1 of 2) Never done   COVID-19 Vaccine (3 - Booster for Pfizer series) 04/02/2020   INFLUENZA VACCINE  01/27/2021   TETANUS/TDAP  04/15/2021   HIV Screening  Completed   Pneumococcal Vaccine 54-3 Years old  Aged Out   HPV VACCINES  Aged Out    The following portions of the patient's history were reviewed and updated as appropriate: She  has a past medical history of Depression. She does not have any pertinent problems on file. She  has a past surgical history that includes Carpal tunnel release. Her family history includes Cancer in an other family member; Coronary artery disease in an other family member; Diabetes in an other family member; Hyperlipidemia in an other family member; Hypertension in an other family  member. She  reports that she has never smoked. She has never used smokeless tobacco. She reports current alcohol use. She reports that she does not use drugs. She has a current medication list which includes the following prescription(s): celecoxib, levonorgestrel, escitalopram, losartan, and omeprazole. Current Outpatient Medications on File Prior to Visit  Medication Sig Dispense Refill   celecoxib (CELEBREX) 100 MG capsule Take 1 capsule (100 mg total) by mouth daily. 30 capsule 2   levonorgestrel (MIRENA) 20 MCG/24HR IUD 1 Intra Uterine Device (1 each total) by Intrauterine route once. 1 each 0   No current facility-administered medications on file prior to visit.   She is allergic to codeine..  Review of Systems Review of Systems  Constitutional: Negative for activity change, appetite change and fatigue.  HENT: Negative for hearing loss, congestion, tinnitus and ear discharge.  dentist q70m Eyes: Negative for visual disturbance (see optho q1y -- vision corrected to 20/20 with glasses).  Respiratory: Negative for cough, chest tightness and shortness of breath.   Cardiovascular: Negative for chest pain, palpitations and leg swelling.  Gastrointestinal: Negative for abdominal pain, diarrhea, constipation and abdominal distention.  Genitourinary: Negative for urgency, frequency, decreased urine volume and difficulty urinating.  Musculoskeletal: Negative for back pain, arthralgias and gait problem.  Skin: Negative for color change, pallor and rash.  Neurological:  Negative for dizziness, light-headedness, numbness and headaches.  Hematological: Negative for adenopathy. Does not bruise/bleed easily.  Psychiatric/Behavioral: Negative for suicidal ideas, confusion, sleep disturbance, self-injury, dysphoric mood, decreased concentration and agitation.     Objective:    BP 126/84 (BP Location: Right Arm, Patient Position: Sitting, Cuff Size: Normal)   Pulse 68   Temp 98.6 F (37 C) (Oral)    Resp 18   Ht 5\' 8"  (1.727 m)   Wt 207 lb 12.8 oz (94.3 kg)   SpO2 98%   BMI 31.60 kg/m  General appearance: alert, cooperative, appears stated age, and no distress Head: Normocephalic, without obvious abnormality, atraumatic Eyes: conjunctivae/corneas clear. PERRL, EOM's intact. Fundi benign. Ears: normal TM's and external ear canals both ears Neck: no adenopathy, no carotid bruit, no JVD, supple, symmetrical, trachea midline, and thyroid not enlarged, symmetric, no tenderness/mass/nodules Back: symmetric, no curvature. ROM normal. No CVA tenderness. Lungs: clear to auscultation bilaterally Breasts: gyn Heart: regular rate and rhythm, S1, S2 normal, no murmur, click, rub or gallop Abdomen: soft, non-tender; bowel sounds normal; no masses,  no organomegaly Pelvic: deferred--gyn Extremities: extremities normal, atraumatic, no cyanosis or edema Pulses: 2+ and symmetric Skin: Skin color, texture, turgor normal. No rashes or lesions Lymph nodes: Cervical, supraclavicular, and axillary nodes normal. Neurologic: Alert and oriented X 3, normal strength and tone. Normal symmetric reflexes. Normal coordination and gait    Assessment:    Healthy female exam.      Plan:    Ghm utd Check labs  See After Visit Summary for Counseling Recommendations   1. Depression, major, single episode, mild (HCC) Stable Con't meds  - escitalopram (LEXAPRO) 10 MG tablet; Take 1 tablet (10 mg total) by mouth daily.  Dispense: 90 tablet; Refill: 3 - TSH  2. Hot flashes Stable  - escitalopram (LEXAPRO) 10 MG tablet; Take 1 tablet (10 mg total) by mouth daily.  Dispense: 90 tablet; Refill: 3  3. Essential hypertension Well controlled, no changes to meds. Encouraged heart healthy diet such as the DASH diet and exercise as tolerated.   - losartan (COZAAR) 100 MG tablet; Take 1 tablet (100 mg total) by mouth daily.  Dispense: 90 tablet; Refill: 1 - TSH - CBC with Differential/Platelet - Lipid panel -  Comprehensive metabolic panel  4. Gastroesophageal reflux disease, unspecified whether esophagitis present stable - omeprazole (PRILOSEC) 20 MG capsule; Take 1 capsule (20 mg total) by mouth daily.  Dispense: 90 capsule; Refill: 3  5. Preventative health care Ghm utd Check labs  - TSH - CBC with Differential/Platelet - Lipid panel - Comprehensive metabolic panel  6. Need for hepatitis C screening test   - Hepatitis C antibody  7. Colon cancer screening   - Ambulatory referral to Gastroenterology  8. Encounter for screening mammogram for malignant neoplasm of breast   - MM DIGITAL SCREENING BILATERAL; Future  9. Need for shingles vaccine   - Varicella-zoster vaccine IM  10. Encounter for gynecological examination without abnormal finding   - Ambulatory referral to Obstetrics / Gynecology  11. Hyperlipidemia, unspecified hyperlipidemia type Encourage heart healthy diet such as MIND or DASH diet, increase exercise, avoid trans fats, simple carbohydrates and processed foods, consider a krill or fish or flaxseed oil cap daily.    12. Primary hypertension Well controlled, no changes to meds. Encouraged heart healthy diet such as the DASH diet and exercise as tolerated.

## 2020-12-26 NOTE — Assessment & Plan Note (Signed)
ghm utd Check labs  

## 2020-12-26 NOTE — Assessment & Plan Note (Signed)
Encourage heart healthy diet such as MIND or DASH diet, increase exercise, avoid trans fats, simple carbohydrates and processed foods, consider a krill or fish or flaxseed oil cap daily.  °

## 2020-12-27 LAB — HEPATITIS C ANTIBODY
Hepatitis C Ab: NONREACTIVE
SIGNAL TO CUT-OFF: 0.01 (ref <1.00)

## 2020-12-30 ENCOUNTER — Other Ambulatory Visit: Payer: Self-pay | Admitting: Family Medicine

## 2020-12-30 DIAGNOSIS — R748 Abnormal levels of other serum enzymes: Secondary | ICD-10-CM

## 2020-12-30 DIAGNOSIS — I10 Essential (primary) hypertension: Secondary | ICD-10-CM

## 2020-12-31 ENCOUNTER — Other Ambulatory Visit: Payer: Self-pay

## 2020-12-31 ENCOUNTER — Encounter (HOSPITAL_BASED_OUTPATIENT_CLINIC_OR_DEPARTMENT_OTHER): Payer: Self-pay

## 2020-12-31 ENCOUNTER — Ambulatory Visit (HOSPITAL_BASED_OUTPATIENT_CLINIC_OR_DEPARTMENT_OTHER)
Admission: RE | Admit: 2020-12-31 | Discharge: 2020-12-31 | Disposition: A | Discharge status: Home / Self Care | Payer: BC Managed Care – PPO | Source: Ambulatory Visit | Attending: Family Medicine | Admitting: Family Medicine

## 2020-12-31 DIAGNOSIS — Z1231 Encounter for screening mammogram for malignant neoplasm of breast: Secondary | ICD-10-CM | POA: Insufficient documentation

## 2021-02-25 ENCOUNTER — Other Ambulatory Visit: Payer: Self-pay

## 2021-02-25 ENCOUNTER — Ambulatory Visit (INDEPENDENT_AMBULATORY_CARE_PROVIDER_SITE_OTHER): Payer: BC Managed Care – PPO

## 2021-02-25 ENCOUNTER — Ambulatory Visit (HOSPITAL_BASED_OUTPATIENT_CLINIC_OR_DEPARTMENT_OTHER)
Admission: RE | Admit: 2021-02-25 | Discharge: 2021-02-25 | Disposition: A | Discharge status: Home / Self Care | Payer: BC Managed Care – PPO | Source: Ambulatory Visit | Attending: Family Medicine | Admitting: Family Medicine

## 2021-02-25 ENCOUNTER — Ambulatory Visit (INDEPENDENT_AMBULATORY_CARE_PROVIDER_SITE_OTHER): Payer: BC Managed Care – PPO | Admitting: Family Medicine

## 2021-02-25 ENCOUNTER — Encounter: Payer: Self-pay | Admitting: Family Medicine

## 2021-02-25 VITALS — BP 143/97 | HR 70 | Resp 18 | Ht 68.0 in | Wt 207.0 lb

## 2021-02-25 DIAGNOSIS — M25422 Effusion, left elbow: Secondary | ICD-10-CM | POA: Diagnosis not present

## 2021-02-25 DIAGNOSIS — R29898 Other symptoms and signs involving the musculoskeletal system: Secondary | ICD-10-CM | POA: Diagnosis not present

## 2021-02-25 DIAGNOSIS — M25522 Pain in left elbow: Secondary | ICD-10-CM | POA: Insufficient documentation

## 2021-02-25 DIAGNOSIS — Z23 Encounter for immunization: Secondary | ICD-10-CM | POA: Diagnosis not present

## 2021-02-25 DIAGNOSIS — M25552 Pain in left hip: Secondary | ICD-10-CM

## 2021-02-25 DIAGNOSIS — M19022 Primary osteoarthritis, left elbow: Secondary | ICD-10-CM | POA: Diagnosis not present

## 2021-02-25 NOTE — Assessment & Plan Note (Signed)
Check xray Warm compresses  con't aleve/ tylenol

## 2021-02-25 NOTE — Assessment & Plan Note (Signed)
Check xray Warm compresses  con't tylenol/ aleve
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
## 2021-02-25 NOTE — Progress Notes (Signed)
Patient here today for shingrix shot. 0.42mL given in left deltoid IM. Patient tolerated well. VIS given. [facility] updated.

## 2021-02-26 ENCOUNTER — Other Ambulatory Visit: Payer: Self-pay | Admitting: Family Medicine

## 2021-02-26 DIAGNOSIS — S42402A Unspecified fracture of lower end of left humerus, initial encounter for closed fracture: Secondary | ICD-10-CM

## 2021-03-04 ENCOUNTER — Other Ambulatory Visit: Payer: Self-pay

## 2021-03-04 ENCOUNTER — Ambulatory Visit (INDEPENDENT_AMBULATORY_CARE_PROVIDER_SITE_OTHER): Payer: BC Managed Care – PPO | Admitting: Orthopedic Surgery

## 2021-03-04 DIAGNOSIS — S52125A Nondisplaced fracture of head of left radius, initial encounter for closed fracture: Secondary | ICD-10-CM | POA: Diagnosis not present

## 2021-03-04 DIAGNOSIS — S52123A Displaced fracture of head of unspecified radius, initial encounter for closed fracture: Secondary | ICD-10-CM | POA: Insufficient documentation
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
## 2021-03-11 ENCOUNTER — Ambulatory Visit: Payer: BC Managed Care – PPO | Admitting: Orthopaedic Surgery

## 2021-03-20 ENCOUNTER — Ambulatory Visit: Payer: BC Managed Care – PPO

## 2021-03-21 ENCOUNTER — Ambulatory Visit: Payer: BC Managed Care – PPO

## 2021-04-03 ENCOUNTER — Other Ambulatory Visit: Payer: BC Managed Care – PPO

## 2021-05-30 ENCOUNTER — Ambulatory Visit: Payer: Self-pay

## 2021-05-30 ENCOUNTER — Ambulatory Visit (INDEPENDENT_AMBULATORY_CARE_PROVIDER_SITE_OTHER): Payer: BC Managed Care – PPO | Admitting: Orthopedic Surgery

## 2021-05-30 ENCOUNTER — Other Ambulatory Visit: Payer: Self-pay

## 2021-05-30 ENCOUNTER — Encounter: Payer: Self-pay | Admitting: Orthopedic Surgery

## 2021-05-30 VITALS — BP 130/82 | HR 71

## 2021-05-30 DIAGNOSIS — G5622 Lesion of ulnar nerve, left upper limb: Secondary | ICD-10-CM

## 2021-05-30 DIAGNOSIS — S52125A Nondisplaced fracture of head of left radius, initial encounter for closed fracture: Secondary | ICD-10-CM | POA: Diagnosis not present
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
## 2021-06-11 ENCOUNTER — Other Ambulatory Visit: Payer: Self-pay

## 2021-06-11 ENCOUNTER — Encounter (HOSPITAL_BASED_OUTPATIENT_CLINIC_OR_DEPARTMENT_OTHER): Payer: Self-pay | Admitting: Orthopedic Surgery

## 2021-06-12 ENCOUNTER — Encounter (HOSPITAL_BASED_OUTPATIENT_CLINIC_OR_DEPARTMENT_OTHER)
Admission: RE | Admit: 2021-06-12 | Discharge: 2021-06-12 | Disposition: A | Discharge status: Home / Self Care | Payer: BC Managed Care – PPO | Source: Ambulatory Visit | Attending: Orthopedic Surgery | Admitting: Orthopedic Surgery

## 2021-06-12 DIAGNOSIS — Z01810 Encounter for preprocedural cardiovascular examination: Secondary | ICD-10-CM | POA: Insufficient documentation

## 2021-06-16 ENCOUNTER — Ambulatory Visit (HOSPITAL_BASED_OUTPATIENT_CLINIC_OR_DEPARTMENT_OTHER): Payer: BC Managed Care – PPO | Admitting: Anesthesiology

## 2021-06-16 ENCOUNTER — Encounter (HOSPITAL_BASED_OUTPATIENT_CLINIC_OR_DEPARTMENT_OTHER): Payer: Self-pay | Admitting: Orthopedic Surgery

## 2021-06-16 ENCOUNTER — Encounter (HOSPITAL_BASED_OUTPATIENT_CLINIC_OR_DEPARTMENT_OTHER)
Admission: RE | Disposition: A | Discharge status: Home / Self Care | Payer: Self-pay | Source: Home / Self Care | Attending: Orthopedic Surgery

## 2021-06-16 ENCOUNTER — Ambulatory Visit (HOSPITAL_BASED_OUTPATIENT_CLINIC_OR_DEPARTMENT_OTHER)
Admission: RE | Admit: 2021-06-16 | Discharge: 2021-06-16 | Disposition: A | Discharge status: Home / Self Care | Payer: BC Managed Care – PPO | Attending: Orthopedic Surgery | Admitting: Orthopedic Surgery

## 2021-06-16 ENCOUNTER — Other Ambulatory Visit: Payer: Self-pay

## 2021-06-16 DIAGNOSIS — K219 Gastro-esophageal reflux disease without esophagitis: Secondary | ICD-10-CM | POA: Diagnosis not present

## 2021-06-16 DIAGNOSIS — F17200 Nicotine dependence, unspecified, uncomplicated: Secondary | ICD-10-CM | POA: Insufficient documentation

## 2021-06-16 DIAGNOSIS — G5622 Lesion of ulnar nerve, left upper limb: Secondary | ICD-10-CM | POA: Insufficient documentation

## 2021-06-16 DIAGNOSIS — Z79899 Other long term (current) drug therapy: Secondary | ICD-10-CM | POA: Diagnosis not present

## 2021-06-16 DIAGNOSIS — R29898 Other symptoms and signs involving the musculoskeletal system: Secondary | ICD-10-CM | POA: Diagnosis not present

## 2021-06-16 DIAGNOSIS — I10 Essential (primary) hypertension: Secondary | ICD-10-CM | POA: Diagnosis not present

## 2021-06-16 HISTORY — DX: Essential (primary) hypertension: I10

## 2021-06-16 HISTORY — PX: ULNAR TUNNEL RELEASE: SHX820

## 2021-06-16 HISTORY — DX: Gastro-esophageal reflux disease without esophagitis: K21.9

## 2021-06-16 HISTORY — PX: IRRIGATION AND DEBRIDEMENT ELBOW: SHX6886

## 2021-06-16 LAB — POCT PREGNANCY, URINE: Preg Test, Ur: NEGATIVE

## 2021-06-16 SURGERY — RELEASE, CUBITAL TUNNEL
Anesthesia: General | Site: Elbow | Laterality: Left

## 2021-06-16 MED ORDER — PROPOFOL 10 MG/ML IV BOLUS
INTRAVENOUS | Status: DC | PRN
  Administered 2021-06-16: 50 mg via INTRAVENOUS
  Administered 2021-06-16: 150 mg via INTRAVENOUS

## 2021-06-16 MED ORDER — ONDANSETRON HCL 4 MG/2ML IJ SOLN
INTRAMUSCULAR | Status: DC | PRN
  Administered 2021-06-16: 4 mg via INTRAVENOUS

## 2021-06-16 MED ORDER — DEXAMETHASONE SODIUM PHOSPHATE 10 MG/ML IJ SOLN
INTRAMUSCULAR | Status: DC | PRN
  Administered 2021-06-16: 10 mg via INTRAVENOUS

## 2021-06-16 MED ORDER — CEFAZOLIN SODIUM-DEXTROSE 2-4 GM/100ML-% IV SOLN
2.0000 g | INTRAVENOUS | Status: AC
  Administered 2021-06-16: 15:00:00 2 g via INTRAVENOUS

## 2021-06-16 MED ORDER — FENTANYL CITRATE (PF) 100 MCG/2ML IJ SOLN
INTRAMUSCULAR | Status: AC
  Filled 2021-06-16: qty 2

## 2021-06-16 MED ORDER — OXYCODONE HCL 5 MG PO TABS: 5.0000 mg | ORAL_TABLET | ORAL | 0 refills | Status: AC | PRN

## 2021-06-16 MED ORDER — LACTATED RINGERS IV SOLN: INTRAVENOUS | Status: DC

## 2021-06-16 MED ORDER — CELECOXIB 200 MG PO CAPS
ORAL_CAPSULE | ORAL | Status: AC
  Filled 2021-06-16: qty 1

## 2021-06-16 MED ORDER — AMISULPRIDE (ANTIEMETIC) 5 MG/2ML IV SOLN: 10.0000 mg | Freq: Once | INTRAVENOUS | Status: DC | PRN

## 2021-06-16 MED ORDER — FENTANYL CITRATE (PF) 100 MCG/2ML IJ SOLN: 25.0000 ug | INTRAMUSCULAR | Status: DC | PRN

## 2021-06-16 MED ORDER — CEFAZOLIN SODIUM-DEXTROSE 2-4 GM/100ML-% IV SOLN
INTRAVENOUS | Status: AC
  Filled 2021-06-16: qty 100

## 2021-06-16 MED ORDER — PROPOFOL 10 MG/ML IV BOLUS
INTRAVENOUS | Status: AC
  Filled 2021-06-16: qty 20

## 2021-06-16 MED ORDER — CELECOXIB 200 MG PO CAPS
200.0000 mg | ORAL_CAPSULE | Freq: Once | ORAL | Status: AC
  Administered 2021-06-16: 12:00:00 200 mg via ORAL

## 2021-06-16 MED ORDER — MIDAZOLAM HCL 2 MG/2ML IJ SOLN
INTRAMUSCULAR | Status: AC
  Filled 2021-06-16: qty 2

## 2021-06-16 MED ORDER — ACETAMINOPHEN 500 MG PO TABS
ORAL_TABLET | ORAL | Status: AC
  Filled 2021-06-16: qty 2

## 2021-06-16 MED ORDER — FENTANYL CITRATE (PF) 100 MCG/2ML IJ SOLN
INTRAMUSCULAR | Status: DC | PRN
  Administered 2021-06-16 (×2): 25 ug via INTRAVENOUS
  Administered 2021-06-16: 50 ug via INTRAVENOUS

## 2021-06-16 MED ORDER — ACETAMINOPHEN 500 MG PO TABS
1000.0000 mg | ORAL_TABLET | Freq: Once | ORAL | Status: AC
  Administered 2021-06-16: 12:00:00 1000 mg via ORAL

## 2021-06-16 MED ORDER — 0.9 % SODIUM CHLORIDE (POUR BTL) OPTIME
TOPICAL | Status: DC | PRN
  Administered 2021-06-16: 15:00:00 150 mL

## 2021-06-16 MED ORDER — LIDOCAINE HCL (CARDIAC) PF 100 MG/5ML IV SOSY
PREFILLED_SYRINGE | INTRAVENOUS | Status: DC | PRN
  Administered 2021-06-16: 100 mg via INTRATRACHEAL

## 2021-06-16 MED ORDER — MIDAZOLAM HCL 5 MG/5ML IJ SOLN
INTRAMUSCULAR | Status: DC | PRN
  Administered 2021-06-16: 2 mg via INTRAVENOUS

## 2021-06-16 MED ORDER — PROMETHAZINE HCL 25 MG/ML IJ SOLN: 6.2500 mg | INTRAMUSCULAR | Status: DC | PRN

## 2021-06-16 MED ORDER — ONDANSETRON HCL 4 MG/2ML IJ SOLN
INTRAMUSCULAR | Status: AC
  Filled 2021-06-16: qty 2

## 2021-06-16 SURGICAL SUPPLY — 49 items
APL PRP STRL LF DISP 70% ISPRP (MISCELLANEOUS) ×1
BLADE SURG 15 STRL LF DISP TIS (BLADE) ×1 IMPLANT
BLADE SURG 15 STRL SS (BLADE) ×3
BNDG CMPR 9X4 STRL LF SNTH (GAUZE/BANDAGES/DRESSINGS) ×1
BNDG ELASTIC 3X5.8 VLCR STR LF (GAUZE/BANDAGES/DRESSINGS) ×3 IMPLANT
BNDG ESMARK 4X9 LF (GAUZE/BANDAGES/DRESSINGS) ×3 IMPLANT
BNDG GAUZE ELAST 4 BULKY (GAUZE/BANDAGES/DRESSINGS) ×3 IMPLANT
BNDG PLASTER X FAST 3X3 WHT LF (CAST SUPPLIES) IMPLANT
BNDG PLSTR 9X3 FST ST WHT (CAST SUPPLIES)
CHLORAPREP W/TINT 26 (MISCELLANEOUS) ×3 IMPLANT
CORD BIPOLAR FORCEPS 12FT (ELECTRODE) ×3 IMPLANT
COVER BACK TABLE 60X90IN (DRAPES) ×3 IMPLANT
COVER MAYO STAND STRL (DRAPES) ×3 IMPLANT
CUFF TOURN SGL QUICK 18X4 (TOURNIQUET CUFF) ×2 IMPLANT
CUFF TOURN SGL QUICK 24 (TOURNIQUET CUFF)
CUFF TRNQT CYL 24X4X16.5-23 (TOURNIQUET CUFF) IMPLANT
DRAPE EXTREMITY T 121X128X90 (DISPOSABLE) ×3 IMPLANT
DRAPE IMP U-DRAPE 54X76 (DRAPES) ×2 IMPLANT
DRAPE SURG 17X23 STRL (DRAPES) ×1 IMPLANT
EXT HOSE W/PLC CONNECTION (MISCELLANEOUS) ×3
EXTENSION HOSE W/PLC CONNECTON (MISCELLANEOUS) IMPLANT
GAUZE 4X4 16PLY ~~LOC~~+RFID DBL (SPONGE) ×2 IMPLANT
GAUZE SPONGE 4X4 12PLY STRL (GAUZE/BANDAGES/DRESSINGS) ×3 IMPLANT
GAUZE XEROFORM 1X8 LF (GAUZE/BANDAGES/DRESSINGS) IMPLANT
GLOVE SURG ENC MOIS LTX SZ7 (GLOVE) ×3 IMPLANT
GLOVE SURG POLYISO LF SZ7 (GLOVE) ×2 IMPLANT
GLOVE SURG UNDER POLY LF SZ7 (GLOVE) ×5 IMPLANT
GOWN STRL REUS W/ TWL LRG LVL3 (GOWN DISPOSABLE) ×1 IMPLANT
GOWN STRL REUS W/TWL LRG LVL3 (GOWN DISPOSABLE) ×6
GOWN STRL REUS W/TWL XL LVL3 (GOWN DISPOSABLE) ×3 IMPLANT
NDL HYPO 25X1 1.5 SAFETY (NEEDLE) IMPLANT
NEEDLE HYPO 25X1 1.5 SAFETY (NEEDLE) IMPLANT
NS IRRIG 1000ML POUR BTL (IV SOLUTION) ×3 IMPLANT
PACK BASIN DAY SURGERY FS (CUSTOM PROCEDURE TRAY) ×3 IMPLANT
PAD CAST 3X4 CTTN HI CHSV (CAST SUPPLIES) ×1 IMPLANT
PADDING CAST COTTON 3X4 STRL (CAST SUPPLIES) ×3
SLEEVE SCD COMPRESS KNEE MED (STOCKING) IMPLANT
SLING ARM FOAM STRAP LRG (SOFTGOODS) ×2 IMPLANT
SUCTION FRAZIER HANDLE 10FR (MISCELLANEOUS) ×3
SUCTION TUBE FRAZIER 10FR DISP (MISCELLANEOUS) IMPLANT
SUT ETHILON 4 0 PS 2 18 (SUTURE) ×3 IMPLANT
SUT MNCRL AB 3-0 PS2 18 (SUTURE) ×2 IMPLANT
SUT VICRYL 4-0 PS2 18IN ABS (SUTURE) IMPLANT
SYR BULB EAR ULCER 3OZ GRN STR (SYRINGE) ×3 IMPLANT
SYR CONTROL 10ML LL (SYRINGE) IMPLANT
TOWEL GREEN STERILE FF (TOWEL DISPOSABLE) ×6 IMPLANT
TUBE CONNECTING 20'X1/4 (TUBING) ×1
TUBE CONNECTING 20X1/4 (TUBING) ×1 IMPLANT
UNDERPAD 30X36 HEAVY ABSORB (UNDERPADS AND DIAPERS) ×3 IMPLANT
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
## 2021-06-16 NOTE — Anesthesia Preprocedure Evaluation (Addendum)
Anesthesia Evaluation  Patient identified by MRN, date of birth, ID band Patient awake    Reviewed: Allergy & Precautions, NPO status , Patient's Chart, lab work & pertinent test results  History of Anesthesia Complications Negative for: history of anesthetic complications  Airway Mallampati: II  TM Distance: >3 FB Neck ROM: Full    Dental no notable dental hx. (+) Dental Advisory Given   Pulmonary Current Smoker and Patient abstained from smoking.,    Pulmonary exam normal        Cardiovascular hypertension, Pt. on medications negative cardio ROS Normal cardiovascular exam     Neuro/Psych PSYCHIATRIC DISORDERS Depression negative neurological ROS     GI/Hepatic Neg liver ROS, GERD  Medicated,  Endo/Other  negative endocrine ROS  Renal/GU negative Renal ROS     Musculoskeletal negative musculoskeletal ROS (+)   Abdominal   Peds  Hematology negative hematology ROS (+)   Anesthesia Other Findings   Reproductive/Obstetrics                            Anesthesia Physical Anesthesia Plan  ASA: 2  Anesthesia Plan: General   Post-op Pain Management: Celebrex PO (pre-op) and Tylenol PO (pre-op)   Induction:   PONV Risk Score and Plan: 3 and Ondansetron, Dexamethasone and Midazolam  Airway Management Planned: LMA  Additional Equipment:   Intra-op Plan:   Post-operative Plan: Extubation in OR  Informed Consent: I have reviewed the patients History and Physical, chart, labs and discussed the procedure including the risks, benefits and alternatives for the proposed anesthesia with the patient or authorized representative who has indicated his/her understanding and acceptance.     Dental advisory given  Plan Discussed with: Anesthesiologist and CRNA  Anesthesia Plan Comments:        Anesthesia Quick Evaluation

## 2021-06-16 NOTE — Discharge Instructions (Addendum)
? ?Charles Benfield, M.D. ?Hand Surgery ? ?POST-OPERATIVE DISCHARGE INSTRUCTIONS ? ? ?PRESCRIPTIONS: ?- You have been given a prescription to be taken as directed for post-operative pain control.  You may also take over the counter ibuprofen/aleve and tylenol for pain. Take this as directed on the packaging. Do not exceed 3000 mg tylenol/acetaminophen in 24 hours. ? ?Ibuprofen 600-800 mg (3-4) tablets by mouth every 6 hours as needed for pain.  ? ?OR ? ?Aleve 2 tablets by mouth every 12 hours (twice daily) as needed for pain. ?  ?AND/OR ? ?Tylenol 1000 mg (2 tablets) every 8 hours as needed for pain. ? ?- Please use your pain medication carefully, as refills are limited and you may not be provided with one.  As stated above, please use over the counter pain medicine - it will also be helpful with decreasing your swelling.  ? ? ?ANESTHESIA: ?-After your surgery, post-surgical discomfort or pain is likely. This discomfort can last several days to a few weeks. At certain times of the day your discomfort may be more intense.  ? ?Did you receive a nerve block?  ? ?- A nerve block can provide pain relief for one hour to two days after your surgery. As long as the nerve block is working, you will experience little or no sensation in the area the surgeon operated on.  ?- As the nerve block wears off, you will begin to experience pain or discomfort. It is very important that you begin taking your prescribed pain medication before the nerve block fully wears off. Treating your pain at the first sign of the block wearing off will ensure your pain is better controlled and more tolerable when full-sensation returns. Do not wait until the pain is intolerable, as the medicine will be less effective. It is better to treat pain in advance than to try and catch up.  ? ?General Anesthesia:  ?If you did not receive a nerve block during your surgery, you will need to start taking your pain medication shortly after your surgery and  should continue to do so as prescribed by your surgeon.   ? ? ?ICE AND ELEVATION: ?- You may use ice for the first 48-72 hours, but it is not critical.   ?- Motion of your fingers is very important to decrease the swelling.  ?- Elevation, as much as possible for the next 48 hours, is critical for decreasing swelling as well as for pain relief. Elevation means when you are seated or lying down, you hand should be at or above your heart. When walking, the hand needs to be at or above the level of your elbow.  ?- If the bandage gets too tight, it may need to be loosened. Please contact our office and we will instruct you in how to do this.  ? ? ?SURGICAL BANDAGES:  ?- Keep your dressing and/or splint clean and dry at all times.  You can remove your dressing 7 days from now and change with a dry dressing or Band-Aids as needed thereafter. ?- You may place a plastic bag over your bandage to shower, but be careful, do not get your bandages wet.  ?- After the bandages have been removed, it is OK to get the stitches wet in a shower or with hand washing. Do Not soak or submerge the wound yet. Please do not use lotions or creams on the stitches.   ?  ? ?HAND THERAPY:  ?- You may not need any. If you do,   we will begin this at your follow up visit in the clinic.  ? ? ?ACTIVITY AND WORK: ?- You are encouraged to move any fingers which are not in the bandage.  ?- Light use of the fingers is allowed to assist the other hand with daily hygiene and eating, but strong gripping or lifting is often uncomfortable and should be avoided.  ?- You might miss a variable period of time from work and hopefully this issue has been discussed prior to surgery. You may not do any heavy work with your affected hand for about 2 weeks.  ? ? ?[facility] ?[address] ?[place],  [place]  [address] ?[phone]  ? ? ? ?Post Anesthesia Home Care Instructions ? ?Activity: ?Get plenty of rest for the remainder of the day. A  responsible individual must stay with you for 24 hours following the procedure.  ?For the next 24 hours, DO NOT: ?-Drive a car ?-Operate machinery ?-Drink alcoholic beverages ?-Take any medication unless instructed by your physician ?-Make any legal decisions or sign important papers. ? ?Meals: ?Start with liquid foods such as gelatin or soup. Progress to regular foods as tolerated. Avoid greasy, spicy, heavy foods. If nausea and/or vomiting occur, drink only clear liquids until the nausea and/or vomiting subsides. Call your physician if vomiting continues. ? ?Special Instructions/Symptoms: ?Your throat may feel dry or sore from the anesthesia or the breathing tube placed in your throat during surgery. If this causes discomfort, gargle with warm salt water. The discomfort should disappear within 24 hours. ? ?If you had a scopolamine patch placed behind your ear for the management of post- operative nausea and/or vomiting: ? ?1. The medication in the patch is effective for 72 hours, after which it should be removed.  Wrap patch in a tissue and discard in the trash. Wash hands thoroughly with soap and water. ?2. You may remove the patch earlier than 72 hours if you experience unpleasant side effects which may include dry mouth, dizziness or visual disturbances. ?3. Avoid touching the patch. Wash your hands with soap and water after contact with the patch. ?    ?

## 2021-06-16 NOTE — Brief Op Note (Signed)
06/16/2021  3:22 PM  PATIENT:  Kari Lopez  50 y.o. female  PRE-OPERATIVE DIAGNOSIS:  Left cubital tunnel syndrome  POST-OPERATIVE DIAGNOSIS:  Left cubital tunnel syndrome  PROCEDURE:  Procedure(s): LEFT CUBITAL TUNNEL RELEASE (Left) IRRIGATION AND DEBRIDEMENT LEFT OLECRANON BURSA (Left)  SURGEON:  Surgeon(s) and Role:    * Marlyne Beards, MD - Primary  PHYSICIAN ASSISTANT:   ASSISTANTS: none   ANESTHESIA:   general  EBL:  <10 cc   BLOOD ADMINISTERED:none  DRAINS: none   LOCAL MEDICATIONS USED:  NONE  SPECIMEN:  No Specimen  DISPOSITION OF SPECIMEN:  N/A  COUNTS:  YES  TOURNIQUET:   Total Tourniquet Time Documented: Upper Arm (Left) - 20 minutes Total: Upper Arm (Left) - 20 minutes   DICTATION: .Dragon Dictation  PLAN OF CARE: Discharge to home after PACU  PATIENT DISPOSITION:  PACU - hemodynamically stable.   Delay start of Pharmacological VTE agent (>24hrs) due to surgical blood loss or risk of bleeding: not applicable

## 2021-06-16 NOTE — Interval H&P Note (Signed)
History and Physical Interval Note:  06/16/2021 1:30 PM  Kari Lopez  has presented today for surgery, with the diagnosis of Left cubital tunnel syndrome.  The various methods of treatment have been discussed with the patient and family. After consideration of risks, benefits and other options for treatment, the patient has consented to  Procedure(s): LEFT CUBITAL TUNNEL RELEASE (Left) as a surgical intervention.  The patient's history has been reviewed, patient examined, no change in status, stable for surgery.  I have reviewed the patient's chart and labs.  Questions were answered to the patient's satisfaction.     [name]

## 2021-06-16 NOTE — Transfer of Care (Signed)
Immediate Anesthesia Transfer of Care Note  Patient: Kari Lopez  Procedure(s) Performed: LEFT CUBITAL TUNNEL RELEASE (Left) IRRIGATION AND DEBRIDEMENT LEFT OLECRANON BURSA (Left: Elbow)  Patient Location: PACU  Anesthesia Type:General  Level of Consciousness: awake, alert  and oriented  Airway & Oxygen Therapy: Patient Spontanous Breathing and Patient connected to face mask oxygen  Post-op Assessment: Report given to RN and Post -op Vital signs reviewed and stable  Post vital signs: Reviewed and stable  Last Vitals:  Vitals Value Taken Time  BP    Temp    Pulse    Resp    SpO2      Last Pain:  Vitals:   06/16/21 1212  TempSrc: Oral  PainSc: 0-No pain      Patients Stated Pain Goal: 6 (06/16/21 1212)  Complications: No notable events documented.

## 2021-06-16 NOTE — Anesthesia Procedure Notes (Signed)
Procedure Name: LMA Insertion Date/Time: 06/16/2021 2:31 PM Performed by: Lauralyn Primes, CRNA Pre-anesthesia Checklist: Patient identified, Emergency Drugs available, Suction available and Patient being monitored Patient Re-evaluated:Patient Re-evaluated prior to induction Oxygen Delivery Method: Circle system utilized Preoxygenation: Pre-oxygenation with 100% oxygen Induction Type: IV induction Ventilation: Mask ventilation without difficulty LMA: LMA inserted LMA Size: 4.0 Number of attempts: 1 Airway Equipment and Method: Bite block Placement Confirmation: positive ETCO2 Tube secured with: Tape Dental Injury: Teeth and Oropharynx as per pre-operative assessment

## 2021-06-17 ENCOUNTER — Encounter (HOSPITAL_BASED_OUTPATIENT_CLINIC_OR_DEPARTMENT_OTHER): Payer: Self-pay | Admitting: Orthopedic Surgery

## 2021-06-17 NOTE — Anesthesia Postprocedure Evaluation (Signed)
Anesthesia Post Note  Patient: Kari Lopez  Procedure(s) Performed: LEFT CUBITAL TUNNEL RELEASE (Left: Elbow) IRRIGATION AND DEBRIDEMENT LEFT OLECRANON BURSA (Left: Elbow)     Anesthesia Post Evaluation No notable events documented.  Last Vitals:  Vitals:   06/16/21 1545 06/16/21 1600  BP: (!) 171/109 (!) 171/116  Pulse: 75 80  Resp: 17 16  Temp:  36.8 C  SpO2: 98% 95%    Last Pain:  Vitals:   06/16/21 1600  TempSrc:   PainSc: 3                  Nyree Applegate DANIEL

## 2021-06-24 ENCOUNTER — Other Ambulatory Visit: Payer: Self-pay | Admitting: Family Medicine

## 2021-06-24 DIAGNOSIS — I10 Essential (primary) hypertension: Secondary | ICD-10-CM

## 2021-12-02 DIAGNOSIS — M170 Bilateral primary osteoarthritis of knee: Secondary | ICD-10-CM | POA: Diagnosis not present

## 2022-01-02 ENCOUNTER — Encounter: Payer: BC Managed Care – PPO | Admitting: Family Medicine

## 2022-01-16 ENCOUNTER — Other Ambulatory Visit: Payer: Self-pay | Admitting: Family Medicine

## 2022-01-16 DIAGNOSIS — K219 Gastro-esophageal reflux disease without esophagitis: Secondary | ICD-10-CM

## 2022-01-16 DIAGNOSIS — R232 Flushing: Secondary | ICD-10-CM

## 2022-01-16 DIAGNOSIS — F320 Major depressive disorder, single episode, mild: Secondary | ICD-10-CM

## 2022-01-20 ENCOUNTER — Telehealth: Payer: Self-pay | Admitting: Family Medicine

## 2022-01-20 DIAGNOSIS — M170 Bilateral primary osteoarthritis of knee: Secondary | ICD-10-CM | POA: Diagnosis not present

## 2022-01-20 NOTE — Telephone Encounter (Signed)
Patient brought in a form from [facility]  Patient would like to be called and she will pick up when its ready  Placed in bin up front

## 2022-01-21 NOTE — Telephone Encounter (Signed)
Noted  

## 2022-01-26 ENCOUNTER — Encounter: Payer: Self-pay | Admitting: Family Medicine

## 2022-01-27 ENCOUNTER — Ambulatory Visit (INDEPENDENT_AMBULATORY_CARE_PROVIDER_SITE_OTHER): Payer: BC Managed Care – PPO | Admitting: Family Medicine

## 2022-01-27 ENCOUNTER — Encounter: Payer: Self-pay | Admitting: Family Medicine

## 2022-01-27 VITALS — BP 144/92 | HR 88 | Temp 98.5°F | Resp 16 | Ht 68.0 in | Wt 211.2 lb

## 2022-01-27 DIAGNOSIS — Z136 Encounter for screening for cardiovascular disorders: Secondary | ICD-10-CM | POA: Diagnosis not present

## 2022-01-27 DIAGNOSIS — I10 Essential (primary) hypertension: Secondary | ICD-10-CM | POA: Diagnosis not present

## 2022-01-27 DIAGNOSIS — R748 Abnormal levels of other serum enzymes: Secondary | ICD-10-CM | POA: Diagnosis not present

## 2022-01-27 DIAGNOSIS — Z01818 Encounter for other preprocedural examination: Secondary | ICD-10-CM | POA: Diagnosis not present

## 2022-01-27 MED ORDER — WEGOVY 0.25 MG/0.5ML ~~LOC~~ SOAJ: 0.2500 mg | SUBCUTANEOUS | 0 refills | Status: DC

## 2022-01-27 MED ORDER — HYDROCHLOROTHIAZIDE 25 MG PO TABS: 25.0000 mg | ORAL_TABLET | Freq: Every day | ORAL | 3 refills | Status: DC

## 2022-01-27 NOTE — Telephone Encounter (Signed)
Form faxed to [facility] at [phone]. Form sent for scanning.

## 2022-01-27 NOTE — Progress Notes (Addendum)
Subjective:   By signing my name below, I, Shylin Keizer, attest that this documentation has been prepared under the direction and in the presence of Donato Schultz, DO 01/27/2022   Patient ID: Kari Lopez, female    DOB: 1970-08-01, 51 y.o.   MRN: [id number]  Chief Complaint  Patient presents with   Surgical Clearance    R TKA TBD w/ Dr. Linna Caprice    HPI Patient is in today for follow up visit.   She plans to have surgery on her right knee due to discomfort.   She states that she has been gaining weight, and is interested in the [facility] injections for weight loss.   She has been drinking pomegranate juice to manage her blood pressure. She avoids having salt or sweets in her diet, but she does drink alcohol. She uses tumeric.   She initially wanted to increase her dose of losartan, but agrees to taking a water pill to lower her fluid weight.    Past Medical History:  Diagnosis Date   Depression    GERD (gastroesophageal reflux disease)    Hypertension     Past Surgical History:  Procedure Laterality Date   CARPAL TUNNEL RELEASE     IRRIGATION AND DEBRIDEMENT ELBOW Left 06/16/2021   Procedure: IRRIGATION AND DEBRIDEMENT LEFT OLECRANON BURSA;  Surgeon: Marlyne Beards, MD;  Location: [facility];  Service: Orthopedics;  Laterality: Left;   ULNAR TUNNEL RELEASE Left 06/16/2021   Procedure: LEFT CUBITAL TUNNEL RELEASE;  Surgeon: Marlyne Beards, MD;  Location:  SURGERY CENTER;  Service: Orthopedics;  Laterality: Left;    Family History  Problem Relation Age of Onset   Coronary artery disease Other    Diabetes Other    Hyperlipidemia Other    Hypertension Other    Cancer Other        LUNG    Social History   Socioeconomic History   Marital status: Married    Spouse name: Loraine Leriche   Number of children: Not on file   Years of education: Not on file   Highest education level: Not on file  Occupational History    Employer: [employer]  Tobacco Use   Smoking status: Some Days    Types: Cigarettes   Smokeless tobacco: Never  Substance and Sexual Activity   Alcohol use: Yes    Comment: 48 oz beer daily   Drug use: No   Sexual activity: Yes    Partners: Male  Other Topics Concern   Not on file  Social History Narrative   Exercise--- gym ,  3x a week   Social Determinants of Health   Financial Resource Strain: Not on file  Food Insecurity: Not on file  Transportation Needs: Not on file  Physical Activity: Not on file  Stress: Not on file  Social Connections: Not on file  Intimate Partner Violence: Not on file    Outpatient Medications Prior to Visit  Medication Sig Dispense Refill   celecoxib (CELEBREX) 100 MG capsule Take 1 capsule (100 mg total) by mouth daily. 30 capsule 2   escitalopram (LEXAPRO) 10 MG tablet TAKE 1 TABLET(10 MG) BY MOUTH DAILY 90 tablet 3   levonorgestrel (MIRENA) 20 MCG/24HR IUD 1 Intra Uterine Device (1 each total) by Intrauterine route once. 1 each 0   losartan (COZAAR) 100 MG tablet TAKE 1 TABLET(100 MG) BY MOUTH DAILY 90 tablet 1   omeprazole (PRILOSEC) 20 MG capsule TAKE 1 CAPSULE(20 MG) BY  MOUTH DAILY 90 capsule 3   No facility-administered medications prior to visit.    Allergies  Allergen Reactions   Codeine     Unknown reaction    Review of Systems  Constitutional:  Negative for fever and malaise/fatigue.  HENT:  Negative for congestion.   Eyes:  Negative for blurred vision.  Respiratory:  Negative for shortness of breath.   Cardiovascular:  Negative for chest pain, palpitations and leg swelling.  Gastrointestinal:  Negative for abdominal pain, blood in stool and nausea.  Genitourinary:  Negative for dysuria and frequency.  Musculoskeletal:  Positive for joint pain (in right knee). Negative for falls.  Skin:  Negative for rash.  Neurological:  Negative for dizziness, loss of consciousness and headaches.  Endo/Heme/Allergies:  Negative for environmental  allergies.  Psychiatric/Behavioral:  Negative for depression. The patient is not nervous/anxious.        Objective:    Physical Exam Vitals and nursing note reviewed.  Constitutional:      Appearance: Normal appearance. She is not ill-appearing.  HENT:     Head: Normocephalic and atraumatic.     Right Ear: External ear normal.     Left Ear: External ear normal.  Eyes:     Extraocular Movements: Extraocular movements intact.     Pupils: Pupils are equal, round, and reactive to light.  Cardiovascular:     Rate and Rhythm: Normal rate and regular rhythm.     Pulses: Normal pulses.     Heart sounds: Normal heart sounds. No murmur heard.    No gallop.  Pulmonary:     Effort: Pulmonary effort is normal. No respiratory distress.     Breath sounds: Normal breath sounds. No wheezing or rales.  Skin:    General: Skin is warm and dry.  Neurological:     Mental Status: She is alert and oriented to person, place, and time.  Psychiatric:        Judgment: Judgment normal.     BP (!) 144/92   Pulse 88   Temp 98.5 F (36.9 C) (Oral)   Resp 16   Ht 5\' 8"  (1.727 m)   Wt 211 lb 4 oz (95.8 kg)   SpO2 97%   BMI 32.12 kg/m  Wt Readings from Last 3 Encounters:  01/27/22 211 lb 4 oz (95.8 kg)  06/16/21 205 lb 11 oz (93.3 kg)  02/25/21 207 lb (93.9 kg)    Diabetic Foot Exam - Simple   No data filed    Lab Results  Component Value Date   WBC 7.5 12/26/2020   HGB 15.1 (H) 12/26/2020   HCT 43.8 12/26/2020   PLT 287.0 12/26/2020   GLUCOSE 99 12/26/2020   CHOL 271 (H) 12/26/2020   TRIG 147.0 12/26/2020   HDL 61.10 12/26/2020   LDLDIRECT 132.0 10/05/2019   LDLCALC 181 (H) 12/26/2020   ALT 47 (H) 12/26/2020   AST 27 12/26/2020   NA 138 12/26/2020   K 4.4 12/26/2020   CL 101 12/26/2020   CREATININE 0.67 12/26/2020   BUN 14 12/26/2020   CO2 28 12/26/2020   TSH 2.83 12/26/2020    Lab Results  Component Value Date   TSH 2.83 12/26/2020   Lab Results  Component Value Date    WBC 7.5 12/26/2020   HGB 15.1 (H) 12/26/2020   HCT 43.8 12/26/2020   MCV 90.2 12/26/2020   PLT 287.0 12/26/2020   Lab Results  Component Value Date   NA 138 12/26/2020   K 4.4  12/26/2020   CO2 28 12/26/2020   GLUCOSE 99 12/26/2020   BUN 14 12/26/2020   CREATININE 0.67 12/26/2020   BILITOT 0.6 12/26/2020   ALKPHOS 60 12/26/2020   AST 27 12/26/2020   ALT 47 (H) 12/26/2020   PROT 7.2 12/26/2020   ALBUMIN 4.8 12/26/2020   CALCIUM 9.7 12/26/2020   GFR 101.68 12/26/2020   Lab Results  Component Value Date   CHOL 271 (H) 12/26/2020   Lab Results  Component Value Date   HDL 61.10 12/26/2020   Lab Results  Component Value Date   LDLCALC 181 (H) 12/26/2020   Lab Results  Component Value Date   TRIG 147.0 12/26/2020   Lab Results  Component Value Date   CHOLHDL 4 12/26/2020   No results found for: "HGBA1C"     Assessment & Plan:   Problem List Items Addressed This Visit       Unprioritized   Pre-operative clearance - Primary    Pt cleared for surgery  ekg  NSR       Relevant Orders   EKG 12-Lead (Completed)   HTN (hypertension)    Poorly controlled will alter medications, encouraged DASH diet, minimize caffeine and obtain adequate sleep. Report concerning symptoms and follow up as directed and as needed Add hctz       Relevant Medications   hydrochlorothiazide (HYDRODIURIL) 25 MG tablet   Other Relevant Orders   Lipid panel   Comprehensive metabolic panel   CBC with Differential/Platelet   Other Visit Diagnoses     Elevated liver enzymes       Relevant Orders   Lipid panel   Comprehensive metabolic panel   CBC with Differential/Platelet   Morbid obesity (HCC)       Relevant Medications   Semaglutide-Weight Management (WEGOVY) 0.25 MG/0.5ML SOAJ        Meds ordered this encounter  Medications   Semaglutide-Weight Management (WEGOVY) 0.25 MG/0.5ML SOAJ    Sig: Inject 0.25 mg into the skin once a week.    Dispense:  2 mL    Refill:  0    hydrochlorothiazide (HYDRODIURIL) 25 MG tablet    Sig: Take 1 tablet (25 mg total) by mouth daily.    Dispense:  90 tablet    Refill:  3    I, Donato Schultz, DO, personally preformed the services described in this documentation.  All medical record entries made by the scribe were at my direction and in my presence.  I have reviewed the chart and discharge instructions (if applicable) and agree that the record reflects my personal performance and is accurate and complete. 01/27/2022   I,Tinashe Williams,acting as a scribe for Donato Schultz, DO.,have documented all relevant documentation on the behalf of Donato Schultz, DO,as directed by  Donato Schultz, DO while in the presence of Donato Schultz, DO.    Donato Schultz, DO

## 2022-01-27 NOTE — Telephone Encounter (Signed)
Received fax confirmation

## 2022-01-27 NOTE — Assessment & Plan Note (Signed)
Pt cleared for surgery  ekg  NSR

## 2022-01-27 NOTE — Assessment & Plan Note (Addendum)
Poorly controlled will alter medications, encouraged DASH diet, minimize caffeine and obtain adequate sleep. Report concerning symptoms and follow up as directed and as needed Add hctz

## 2022-01-28 ENCOUNTER — Telehealth: Payer: Self-pay

## 2022-01-28 LAB — COMPREHENSIVE METABOLIC PANEL
ALT: 27 U/L (ref 0–35)
AST: 23 U/L (ref 0–37)
Albumin: 4.6 g/dL (ref 3.5–5.2)
Alkaline Phosphatase: 64 U/L (ref 39–117)
BUN: 14 mg/dL (ref 6–23)
CO2: 29 mEq/L (ref 19–32)
Calcium: 9.5 mg/dL (ref 8.4–10.5)
Chloride: 99 mEq/L (ref 96–112)
Creatinine, Ser: 0.79 mg/dL (ref 0.40–1.20)
GFR: 86.36 mL/min (ref >60.00)
Glucose, Bld: 93 mg/dL (ref 70–99)
Potassium: 4.6 mEq/L (ref 3.5–5.1)
Sodium: 136 mEq/L (ref 135–145)
Total Bilirubin: 0.3 mg/dL (ref 0.2–1.2)
Total Protein: 7.2 g/dL (ref 6.0–8.3)

## 2022-01-28 LAB — CBC WITH DIFFERENTIAL/PLATELET
Basophils Absolute: 0.1 10*3/uL (ref 0.0–0.1)
Basophils Relative: 1.3 % (ref 0.0–3.0)
Eosinophils Absolute: 0.2 10*3/uL (ref 0.0–0.7)
Eosinophils Relative: 2.9 % (ref 0.0–5.0)
HCT: 38.9 % (ref 36.0–46.0)
Hemoglobin: 13 g/dL (ref 12.0–15.0)
Lymphocytes Relative: 33.7 % (ref 12.0–46.0)
Lymphs Abs: 2.5 10*3/uL (ref 0.7–4.0)
MCHC: 33.5 g/dL (ref 30.0–36.0)
MCV: 93.1 fl (ref 78.0–100.0)
Monocytes Absolute: 0.8 10*3/uL (ref 0.1–1.0)
Monocytes Relative: 10.7 % (ref 3.0–12.0)
Neutro Abs: 3.8 10*3/uL (ref 1.4–7.7)
Neutrophils Relative %: 51.4 % (ref 43.0–77.0)
Platelets: 356 10*3/uL (ref 150.0–400.0)
RBC: 4.18 Mil/uL (ref 3.87–5.11)
RDW: 13.3 % (ref 11.5–15.5)
WBC: 7.5 10*3/uL (ref 4.0–10.5)

## 2022-01-28 LAB — LIPID PANEL
Cholesterol: 305 mg/dL — ABNORMAL HIGH (ref 0–200)
HDL: 53.6 mg/dL (ref >39.00)
Total CHOL/HDL Ratio: 6
Triglycerides: 548 mg/dL — ABNORMAL HIGH (ref 0.0–149.0)

## 2022-01-28 LAB — LDL CHOLESTEROL, DIRECT: Direct LDL: 190 mg/dL

## 2022-01-28 NOTE — Telephone Encounter (Signed)
PA initiated via Covermymeds; KEY: [id number]. Awaiting determination.

## 2022-02-03 NOTE — Telephone Encounter (Signed)
PA denied. No BMI given or whether Pt is on diet/exercise program. I have appealed w/ BMI and co morbidities- asked that the plan call if records are needed prior to denying request.

## 2022-02-03 NOTE — Telephone Encounter (Signed)
[employer] called with the following reference number regarding [employer] PA:  Ref: [id number]

## 2022-02-04 ENCOUNTER — Other Ambulatory Visit: Payer: Self-pay | Admitting: Family Medicine

## 2022-02-04 ENCOUNTER — Other Ambulatory Visit: Payer: Self-pay

## 2022-02-04 DIAGNOSIS — E785 Hyperlipidemia, unspecified: Secondary | ICD-10-CM

## 2022-02-04 MED ORDER — ROSUVASTATIN CALCIUM 20 MG PO TABS
20.0000 mg | ORAL_TABLET | Freq: Every day | ORAL | 2 refills | Status: DC
Start: 2022-02-04 — End: 2022-05-05

## 2022-02-04 NOTE — Telephone Encounter (Signed)
Appeal approved. Effective 01/28/2022 - 09/04/2022.   Appeal Case [id number] is overturned. For further questions, call [phone].

## 2022-02-08 ENCOUNTER — Ambulatory Visit: Payer: Self-pay | Admitting: Student

## 2022-02-09 ENCOUNTER — Ambulatory Visit (INDEPENDENT_AMBULATORY_CARE_PROVIDER_SITE_OTHER): Payer: BC Managed Care – PPO | Admitting: Family Medicine

## 2022-02-09 ENCOUNTER — Encounter: Payer: Self-pay | Admitting: Family Medicine

## 2022-02-09 VITALS — BP 120/90 | HR 75 | Temp 99.4°F | Resp 18 | Ht 68.0 in | Wt 213.0 lb

## 2022-02-09 DIAGNOSIS — Z1211 Encounter for screening for malignant neoplasm of colon: Secondary | ICD-10-CM | POA: Diagnosis not present

## 2022-02-09 DIAGNOSIS — Z23 Encounter for immunization: Secondary | ICD-10-CM

## 2022-02-09 DIAGNOSIS — I10 Essential (primary) hypertension: Secondary | ICD-10-CM

## 2022-02-09 DIAGNOSIS — Z0000 Encounter for general adult medical examination without abnormal findings: Secondary | ICD-10-CM | POA: Diagnosis not present

## 2022-02-09 DIAGNOSIS — E785 Hyperlipidemia, unspecified: Secondary | ICD-10-CM

## 2022-02-09 MED ORDER — TETANUS-DIPHTH-ACELL PERTUSSIS 5-2.5-18.5 LF-MCG/0.5 IM SUSP: 0.5000 mL | Freq: Once | INTRAMUSCULAR | 0 refills | Status: DC

## 2022-02-09 NOTE — Progress Notes (Signed)
Subjective:   By signing my name below, I, Cassell Clement, attest that this documentation has been prepared under the direction and in the presence of Donato Schultz DO 02/09/2022   Patient ID: Kari Lopez, female    DOB: [birth year], 51 y.o.   MRN: [id number]  Chief Complaint  Patient presents with   Annual Exam    Pt states fasting     HPI Patient is in today for a comprehensive physical exam  She reports that she is waiting to hear back from her insurance for her knee replacement surgery.   She mentions that she has started her 20 Mg of Crestor Lab Results  Component Value Date   CHOL 305 (H) 01/27/2022   HDL 53.60 01/27/2022   LDLCALC 181 (H) 12/26/2020   LDLDIRECT 190.0 01/27/2022   TRIG (H) 01/27/2022    548.0 Triglyceride is over 400; calculations on Lipids are invalid.   CHOLHDL 6 01/27/2022   As of today's visit, her diastolic readings are still elevating. BP Readings from Last 3 Encounters:  02/09/22 (!) 120/90  01/27/22 (!) 144/92  06/16/21 (!) 171/116   Pulse Readings from Last 3 Encounters:  02/09/22 75  01/27/22 88  06/16/21 80   She denies having any fever, new muscle pain, joint pain , new moles, congestion, sinus pain, sore throat, chest pain, palpations, cough, SOB ,wheezing,n/v/d constipation, blood in stool, dysuria, frequency, hematuria, at this time  She denies of any changes to her family medical history. She reports no recent surgeries.  She reports that she has not had a colonoscopy before  Dexa/PSA: Pap Smear last completed on 07/07/2013 Mammogram last completed on 12/31/2020 She is due for a tetanus vaccine, she is interested in receiving one during today's visit.    Past Medical History:  Diagnosis Date   Depression    GERD (gastroesophageal reflux disease)    Hypertension     Past Surgical History:  Procedure Laterality Date   CARPAL TUNNEL RELEASE     IRRIGATION AND DEBRIDEMENT ELBOW Left 06/16/2021   Procedure:  IRRIGATION AND DEBRIDEMENT LEFT OLECRANON BURSA;  Surgeon: Marlyne Beards, MD;  Location: [facility];  Service: Orthopedics;  Laterality: Left;   ULNAR TUNNEL RELEASE Left 06/16/2021   Procedure: LEFT CUBITAL TUNNEL RELEASE;  Surgeon: Marlyne Beards, MD;  Location: [facility];  Service: Orthopedics;  Laterality: Left;    Family History  Problem Relation Age of Onset   Coronary artery disease Other    Diabetes Other    Hyperlipidemia Other    Hypertension Other    Cancer Other        LUNG    Social History   Socioeconomic History   Marital status: Married    Spouse name: Loraine Leriche   Number of children: Not on file   Years of education: Not on file   Highest education level: Not on file  Occupational History    Employer: [employer]  Tobacco Use   Smoking status: Some Days    Types: Cigarettes   Smokeless tobacco: Never  Substance and Sexual Activity   Alcohol use: Yes    Comment: 48 oz beer daily   Drug use: No   Sexual activity: Yes    Partners: Male  Other Topics Concern   Not on file  Social History Narrative   Exercise--- gym ,  3x a week   Social Determinants of Health   Financial Resource Strain: Not on file  Food  Insecurity: Not on file  Transportation Needs: Not on file  Physical Activity: Not on file  Stress: Not on file  Social Connections: Not on file  Intimate Partner Violence: Not on file    Outpatient Medications Prior to Visit  Medication Sig Dispense Refill   celecoxib (CELEBREX) 100 MG capsule Take 1 capsule (100 mg total) by mouth daily. 30 capsule 2   escitalopram (LEXAPRO) 10 MG tablet TAKE 1 TABLET(10 MG) BY MOUTH DAILY 90 tablet 3   hydrochlorothiazide (HYDRODIURIL) 25 MG tablet Take 1 tablet (25 mg total) by mouth daily. 90 tablet 3   levonorgestrel (MIRENA) 20 MCG/24HR IUD 1 Intra Uterine Device (1 each total) by Intrauterine route once. 1 each 0   losartan (COZAAR) 100 MG tablet TAKE 1 TABLET(100 MG) BY  MOUTH DAILY 90 tablet 1   omeprazole (PRILOSEC) 20 MG capsule TAKE 1 CAPSULE(20 MG) BY MOUTH DAILY 90 capsule 3   rosuvastatin (CRESTOR) 20 MG tablet Take 1 tablet (20 mg total) by mouth daily. 30 tablet 2   Semaglutide-Weight Management (WEGOVY) 0.25 MG/0.5ML SOAJ Inject 0.25 mg into the skin once a week. 2 mL 0   No facility-administered medications prior to visit.    Allergies  Allergen Reactions   Codeine     Unknown reaction    Review of Systems  Constitutional:  Negative for fever and malaise/fatigue.  HENT:  Negative for congestion, sinus pain and sore throat.   Eyes:  Negative for blurred vision.  Respiratory:  Negative for cough, shortness of breath and wheezing.   Cardiovascular:  Negative for chest pain, palpitations and leg swelling.  Gastrointestinal:  Negative for abdominal pain, blood in stool, constipation, diarrhea, nausea and vomiting.  Genitourinary:  Negative for dysuria, frequency and hematuria.  Musculoskeletal:  Negative for falls, joint pain and myalgias.  Skin:  Negative for rash.       (-) [place]  Neurological:  Negative for dizziness, loss of consciousness and headaches.  Endo/Heme/Allergies:  Negative for environmental allergies.  Psychiatric/Behavioral:  Negative for depression. The patient is not nervous/anxious.        Objective:    Physical Exam Vitals and nursing note reviewed.  Constitutional:      General: She is not in acute distress.    Appearance: Normal appearance. She is not ill-appearing.  HENT:     Head: Normocephalic and atraumatic.     Right Ear: Tympanic membrane, ear canal and external ear normal.     Left Ear: Tympanic membrane, ear canal and external ear normal.     Mouth/Throat:     Pharynx: No oropharyngeal exudate.  Eyes:     Extraocular Movements: Extraocular movements intact.     Pupils: Pupils are equal, round, and reactive to light.  Cardiovascular:     Rate and Rhythm: Normal rate and regular rhythm.     Heart  sounds: Normal heart sounds. No murmur heard.    No gallop.  Pulmonary:     Effort: Pulmonary effort is normal. No respiratory distress.     Breath sounds: Normal breath sounds. No wheezing or rales.  Abdominal:     General: Bowel sounds are normal. There is no distension.     Palpations: Abdomen is soft.     Tenderness: There is no abdominal tenderness. There is no guarding.  Musculoskeletal:        General: Normal range of motion.  Skin:    General: Skin is warm and dry.  Neurological:     Mental  Status: She is alert and oriented to person, place, and time.  Psychiatric:        Judgment: Judgment normal.     BP (!) 120/90 (BP Location: Left Arm, Patient Position: Sitting, Cuff Size: Normal)   Pulse 75   Temp 99.4 F (37.4 C) (Oral)   Resp 18   Ht 5\' 8"  (1.727 m)   Wt 213 lb (96.6 kg)   SpO2 97%   BMI 32.39 kg/m  Wt Readings from Last 3 Encounters:  02/09/22 213 lb (96.6 kg)  01/27/22 211 lb 4 oz (95.8 kg)  06/16/21 205 lb 11 oz (93.3 kg)    Diabetic Foot Exam - Simple   No data filed    Lab Results  Component Value Date   WBC 7.5 01/27/2022   HGB 13.0 01/27/2022   HCT 38.9 01/27/2022   PLT 356.0 01/27/2022   GLUCOSE 93 01/27/2022   CHOL 305 (H) 01/27/2022   TRIG (H) 01/27/2022    548.0 Triglyceride is over 400; calculations on Lipids are invalid.   HDL 53.60 01/27/2022   LDLDIRECT 190.0 01/27/2022   LDLCALC 181 (H) 12/26/2020   ALT 27 01/27/2022   AST 23 01/27/2022   NA 136 01/27/2022   K 4.6 01/27/2022   CL 99 01/27/2022   CREATININE 0.79 01/27/2022   BUN 14 01/27/2022   CO2 29 01/27/2022   TSH 2.83 12/26/2020    Lab Results  Component Value Date   TSH 2.83 12/26/2020   Lab Results  Component Value Date   WBC 7.5 01/27/2022   HGB 13.0 01/27/2022   HCT 38.9 01/27/2022   MCV 93.1 01/27/2022   PLT 356.0 01/27/2022   Lab Results  Component Value Date   NA 136 01/27/2022   K 4.6 01/27/2022   CO2 29 01/27/2022   GLUCOSE 93 01/27/2022    BUN 14 01/27/2022   CREATININE 0.79 01/27/2022   BILITOT 0.3 01/27/2022   ALKPHOS 64 01/27/2022   AST 23 01/27/2022   ALT 27 01/27/2022   PROT 7.2 01/27/2022   ALBUMIN 4.6 01/27/2022   CALCIUM 9.5 01/27/2022   GFR 86.36 01/27/2022   Lab Results  Component Value Date   CHOL 305 (H) 01/27/2022   Lab Results  Component Value Date   HDL 53.60 01/27/2022   Lab Results  Component Value Date   LDLCALC 181 (H) 12/26/2020   Lab Results  Component Value Date   TRIG (H) 01/27/2022    548.0 Triglyceride is over 400; calculations on Lipids are invalid.   Lab Results  Component Value Date   CHOLHDL 6 01/27/2022   No results found for: "HGBA1C"     Assessment & Plan:   Problem List Items Addressed This Visit       Unprioritized   Preventative health care - Primary    ghm utd Check labs  See avs       Relevant Orders   Comprehensive metabolic panel   Lipid panel   TSH   Hyperlipidemia    Encourage heart healthy diet such as MIND or DASH diet, increase exercise, avoid trans fats, simple carbohydrates and processed foods, consider a krill or fish or flaxseed oil cap daily.       Relevant Orders   Comprehensive metabolic panel   Lipid panel   TSH   HTN (hypertension)    Well controlled, no changes to meds. Encouraged heart healthy diet such as the DASH diet and exercise as tolerated.  Other Visit Diagnoses     Colon cancer screening       Relevant Orders   Ambulatory referral to Gastroenterology   Need for tetanus booster       Relevant Orders   Tdap vaccine greater than or equal to 7yo IM (Completed)      Meds ordered this encounter  Medications   DISCONTD: Tdap (BOOSTRIX) 5-2.5-18.5 LF-MCG/0.5 injection    Sig: Inject 0.5 mLs into the muscle once for 1 dose.    Dispense:  0.5 mL    Refill:  0    Substitution is accepted. Please send fax when administered to: [phone]    I, Donato Schultz, DO, personally preformed the services described  in this documentation.  All medical record entries made by the scribe were at my direction and in my presence.  I have reviewed the chart and discharge instructions (if applicable) and agree that the record reflects my personal performance and is accurate and complete. 02/09/2022   I,Amber Collins,acting as a scribe for Donato Schultz, DO.,have documented all relevant documentation on the behalf of Donato Schultz, DO,as directed by  Donato Schultz, DO while in the presence of Donato Schultz, DO.    Donato Schultz, DO

## 2022-02-09 NOTE — Assessment & Plan Note (Signed)
Well controlled, no changes to meds. Encouraged heart healthy diet such as the DASH diet and exercise as tolerated.  °

## 2022-02-09 NOTE — Patient Instructions (Signed)
Preventive Care 56-52 Years Old, Female Preventive care refers to lifestyle choices and visits with your health care provider that can promote health and wellness. Preventive care visits are also called wellness exams. What can I expect for my preventive care visit? Counseling Your health care provider may ask you questions about your: Medical history, including: Past medical problems. Family medical history. Pregnancy history. Current health, including: Menstrual cycle. Method of birth control. Emotional well-being. Home life and relationship well-being. Sexual activity and sexual health. Lifestyle, including: Alcohol, nicotine or tobacco, and drug use. Access to firearms. Diet, exercise, and sleep habits. Work and work Statistician. Sunscreen use. Safety issues such as seatbelt and bike helmet use. Physical exam Your health care provider will check your: Height and weight. These may be used to calculate your BMI (body mass index). BMI is a measurement that tells if you are at a healthy weight. Waist circumference. This measures the distance around your waistline. This measurement also tells if you are at a healthy weight and may help predict your risk of certain diseases, such as type 2 diabetes and high blood pressure. Heart rate and blood pressure. Body temperature. Skin for abnormal spots. What immunizations do I need?  Vaccines are usually given at various ages, according to a schedule. Your health care provider will recommend vaccines for you based on your age, medical history, and lifestyle or other factors, such as travel or where you work. What tests do I need? Screening Your health care provider may recommend screening tests for certain conditions. This may include: Lipid and cholesterol levels. Diabetes screening. This is done by checking your blood sugar (glucose) after you have not eaten for a while (fasting). Pelvic exam and Pap test. Hepatitis B test. Hepatitis C  test. HIV (human immunodeficiency virus) test. STI (sexually transmitted infection) testing, if you are at risk. Lung cancer screening. Colorectal cancer screening. Mammogram. Talk with your health care provider about when you should start having regular mammograms. This may depend on whether you have a family history of breast cancer. BRCA-related cancer screening. This may be done if you have a family history of breast, ovarian, tubal, or peritoneal cancers. Bone density scan. This is done to screen for osteoporosis. Talk with your health care provider about your test results, treatment options, and if necessary, the need for more tests. Follow these instructions at home: Eating and drinking  Eat a diet that includes fresh fruits and vegetables, whole grains, lean protein, and low-fat dairy products. Take vitamin and mineral supplements as recommended by your health care provider. Do not drink alcohol if: Your health care provider tells you not to drink. You are pregnant, may be pregnant, or are planning to become pregnant. If you drink alcohol: Limit how much you have to 0-1 drink a day. Know how much alcohol is in your drink. In the [place] one drink equals one 12 oz bottle of beer (355 mL), one 5 oz glass of wine (148 mL), or one 1 oz glass of hard liquor (44 mL). Lifestyle Brush your teeth every morning and night with fluoride toothpaste. Floss one time each day. Exercise for at least 30 minutes 5 or more days each week. Do not use any products that contain nicotine or tobacco. These products include cigarettes, chewing tobacco, and vaping devices, such as e-cigarettes. If you need help quitting, ask your health care provider. Do not use drugs. If you are sexually active, practice safe sex. Use a condom or other form of protection to  prevent STIs. If you do not wish to become pregnant, use a form of birth control. If you plan to become pregnant, see your health care provider for a  prepregnancy visit. Take aspirin only as told by your health care provider. Make sure that you understand how much to take and what form to take. Work with your health care provider to find out whether it is safe and beneficial for you to take aspirin daily. Find healthy ways to manage stress, such as: Meditation, yoga, or listening to music. Journaling. Talking to a trusted person. Spending time with friends and family. Minimize exposure to UV radiation to reduce your risk of skin cancer. Safety Always wear your seat belt while driving or riding in a vehicle. Do not drive: If you have been drinking alcohol. Do not ride with someone who has been drinking. When you are tired or distracted. While texting. If you have been using any mind-altering substances or drugs. Wear a helmet and other protective equipment during sports activities. If you have firearms in your house, make sure you follow all gun safety procedures. Seek help if you have been physically or sexually abused. What's next? Visit your health care provider once a year for an annual wellness visit. Ask your health care provider how often you should have your eyes and teeth checked. Stay up to date on all vaccines. This information is not intended to replace advice given to you by your health care provider. Make sure you discuss any questions you have with your health care provider. Document Revised: 12/11/2020 Document Reviewed: 12/11/2020 Elsevier Patient Education  [employer].

## 2022-02-09 NOTE — Assessment & Plan Note (Signed)
Encourage heart healthy diet such as MIND or DASH diet, increase exercise, avoid trans fats, simple carbohydrates and processed foods, consider a krill or fish or flaxseed oil cap daily.  °

## 2022-02-09 NOTE — Assessment & Plan Note (Signed)
ghm utd Check labs  See avs  

## 2022-02-12 ENCOUNTER — Ambulatory Visit: Payer: Self-pay | Admitting: Student

## 2022-02-12 NOTE — H&P (Signed)
TOTAL KNEE ADMISSION H&P  Patient is being admitted for right total knee arthroplasty.  Subjective:  Chief Complaint:right knee pain.  HPI: Kari Lopez, 51 y.o. female, has a history of pain and functional disability in the right knee due to trauma and arthritis and has failed non-surgical conservative treatments for greater than 12 weeks to includeNSAID's and/or analgesics, corticosteriod injections, viscosupplementation injections, flexibility and strengthening excercises, and activity modification.  Onset of symptoms was gradual, starting 4 years ago with rapidlly worsening course since that time. The patient noted prior procedures on the knee to include  arthroscopy and ACL reconstruction on the right knee(s).  Patient currently rates pain in the right knee(s) at 10 out of 10 with activity. Patient has night pain, worsening of pain with activity and weight bearing, pain that interferes with activities of daily living, pain with passive range of motion, crepitus, and joint swelling.  Patient has evidence of subchondral cysts, subchondral sclerosis, periarticular osteophytes, and joint space narrowing by imaging studies. There is no active infection.  Patient Active Problem List   Diagnosis Date Noted   Pre-operative clearance 01/27/2022   Cubital tunnel syndrome on left 05/30/2021   Hip pain, acute, left 02/25/2021   Elbow pain, left 02/25/2021   Preventative health care 12/26/2020   Chronic pain of both knees 10/05/2019   Depression, major, single episode, mild (HCC) 10/05/2019   HTN (hypertension) 02/13/2019   Depression, major, single episode, moderate (HCC) 05/16/2017   GERD (gastroesophageal reflux disease) 05/16/2017   Hyperlipidemia 02/06/2013   DEPRESSION, RECURRENT 04/16/2008   Past Medical History:  Diagnosis Date   Depression    GERD (gastroesophageal reflux disease)    Hypertension     Past Surgical History:  Procedure Laterality Date   CARPAL TUNNEL RELEASE      IRRIGATION AND DEBRIDEMENT ELBOW Left 06/16/2021   Procedure: IRRIGATION AND DEBRIDEMENT LEFT OLECRANON BURSA;  Surgeon: Marlyne Beards, MD;  Location: [facility];  Service: Orthopedics;  Laterality: Left;   ULNAR TUNNEL RELEASE Left 06/16/2021   Procedure: LEFT CUBITAL TUNNEL RELEASE;  Surgeon: Marlyne Beards, MD;  Location: [facility];  Service: Orthopedics;  Laterality: Left;    Current Outpatient Medications  Medication Sig Dispense Refill Last Dose   celecoxib (CELEBREX) 100 MG capsule Take 1 capsule (100 mg total) by mouth daily. 30 capsule 2    escitalopram (LEXAPRO) 10 MG tablet TAKE 1 TABLET(10 MG) BY MOUTH DAILY 90 tablet 3    hydrochlorothiazide (HYDRODIURIL) 25 MG tablet Take 1 tablet (25 mg total) by mouth daily. 90 tablet 3    levonorgestrel (MIRENA) 20 MCG/24HR IUD 1 Intra Uterine Device (1 each total) by Intrauterine route once. 1 each 0    losartan (COZAAR) 100 MG tablet TAKE 1 TABLET(100 MG) BY MOUTH DAILY 90 tablet 1    omeprazole (PRILOSEC) 20 MG capsule TAKE 1 CAPSULE(20 MG) BY MOUTH DAILY 90 capsule 3    Polyethylene Glycol 400 0.25 % GEL Place 1 Application into both eyes daily as needed (dry eyes).      rosuvastatin (CRESTOR) 20 MG tablet Take 1 tablet (20 mg total) by mouth daily. 30 tablet 2    Semaglutide-Weight Management (WEGOVY) 0.25 MG/0.5ML SOAJ Inject 0.25 mg into the skin once a week. 2 mL 0    No current facility-administered medications for this visit.   Allergies  Allergen Reactions   Codeine     Unknown reaction    Social History   Tobacco Use   Smoking  status: Some Days    Types: Cigarettes   Smokeless tobacco: Never  Substance Use Topics   Alcohol use: Yes    Comment: 48 oz beer daily    Family History  Problem Relation Age of Onset   Coronary artery disease Other    Diabetes Other    Hyperlipidemia Other    Hypertension Other    Cancer Other        LUNG     Review of Systems  Musculoskeletal:   Positive for arthralgias, gait problem, joint swelling and myalgias.  All other systems reviewed and are negative.   Objective:  Physical Exam Constitutional:      Appearance: Normal appearance.  HENT:     Head: Normocephalic and atraumatic.     Nose: Nose normal.     Mouth/Throat:     Mouth: Mucous membranes are moist.     Pharynx: Oropharynx is clear.  Eyes:     Extraocular Movements: Extraocular movements intact.  Cardiovascular:     Rate and Rhythm: Normal rate and regular rhythm.     Pulses: Normal pulses.     Heart sounds: Normal heart sounds.  Pulmonary:     Effort: Pulmonary effort is normal.     Breath sounds: Normal breath sounds.  Abdominal:     General: Abdomen is flat.     Palpations: Abdomen is soft.  Genitourinary:    Comments: deferred Musculoskeletal:     Cervical back: Normal range of motion and neck supple.     Comments: Examination of the right knee reveals no skin wounds or erythema. Healed incisions from ACL reconstruction. Tenderness to palpation about the medial joint line of the right knee. AROM 10-120 degrees with a mild flexion contracture. Moderate crepitus with passive motion. No effusion noted. No instability. Moderate patellofemoral crepitus.  Sensory and motor function intact in LE bilaterally. Distal pedal pulses 2+ bilaterally.   No pedal edema. Calves soft and non-tender.   Skin:    General: Skin is warm and dry.     Capillary Refill: Capillary refill takes less than 2 seconds.  Neurological:     General: No focal deficit present.     Mental Status: She is alert and oriented to person, place, and time.  Psychiatric:        Mood and Affect: Mood normal.        Behavior: Behavior normal.        Thought Content: Thought content normal.        Judgment: Judgment normal.     Vital signs in last 24 hours: @VSRANGES @  Labs:   Estimated body mass index is 32.39 kg/m as calculated from the following:   Height as of 02/09/22: 5\' 8"   (1.727 m).   Weight as of 02/09/22: 96.6 kg.   Imaging Review Plain radiographs demonstrate severe degenerative joint disease of the right knee(s). The overall alignment ismild varus. The bone quality appears to be adequate for age and reported activity level.      Assessment/Plan:  End stage traumatic arthritis, right knee   The patient history, physical examination, clinical judgment of the provider and imaging studies are consistent with end stage degenerative joint disease of the right knee(s) and total knee arthroplasty is deemed medically necessary. The treatment options including medical management, injection therapy arthroscopy and arthroplasty were discussed at length. The risks and benefits of total knee arthroplasty were presented and reviewed. The risks due to aseptic loosening, infection, stiffness, patella tracking problems, thromboembolic complications and  other imponderables were discussed. The patient acknowledged the explanation, agreed to proceed with the plan and consent was signed. Patient is being admitted for inpatient treatment for surgery, pain control, PT, OT, prophylactic antibiotics, VTE prophylaxis, progressive ambulation and ADL's and discharge planning. The patient is planning to be discharged home with OPPT.   Therapy Plans: outpatient therapy EO Disposition: Home with husband Planned DVT Prophylaxis: aspirin 81mg  BID DME needed: Has rolling walker. Wants iceman today.  PCP: Cleared TXA: IV Allergies: NDKA Anesthesia Concerns: None.  BMI: 33 Last HgbA1c: Not diabetic.  Other: - Cr. 0.79, Hgb 13.0, K+ 4.6.  - Rt knee ACL reconstruction and scope 2006.  - ACL injury left knee 2019 - Celebrex - stopped taking due to stomach pain.  - Oxycodone, zofran, methocarbamol.    Patient's anticipated LOS is less than 2 midnights, meeting these requirements: - Younger than 25 - Lives within 1 hour of care - Has a competent adult at home to recover with post-op  recover - NO history of  - Chronic pain requiring opiods  - Diabetes  - Coronary Artery Disease  - Heart failure  - Heart attack  - Stroke  - DVT/VTE  - Cardiac arrhythmia  - Respiratory Failure/COPD  - Renal failure  - Anemia  - Advanced Liver disease

## 2022-02-12 NOTE — H&P (View-Only) (Signed)
TOTAL KNEE ADMISSION H&P  Patient is being admitted for right total knee arthroplasty.  Subjective:  Chief Complaint:right knee pain.  HPI: Kari Lopez, 51 y.o. female, has a history of pain and functional disability in the right knee due to trauma and arthritis and has failed non-surgical conservative treatments for greater than 12 weeks to includeNSAID's and/or analgesics, corticosteriod injections, viscosupplementation injections, flexibility and strengthening excercises, and activity modification.  Onset of symptoms was gradual, starting 4 years ago with rapidlly worsening course since that time. The patient noted prior procedures on the knee to include  arthroscopy and ACL reconstruction on the right knee(s).  Patient currently rates pain in the right knee(s) at 10 out of 10 with activity. Patient has night pain, worsening of pain with activity and weight bearing, pain that interferes with activities of daily living, pain with passive range of motion, crepitus, and joint swelling.  Patient has evidence of subchondral cysts, subchondral sclerosis, periarticular osteophytes, and joint space narrowing by imaging studies. There is no active infection.  Patient Active Problem List   Diagnosis Date Noted   Pre-operative clearance 01/27/2022   Cubital tunnel syndrome on left 05/30/2021   Hip pain, acute, left 02/25/2021   Elbow pain, left 02/25/2021   Preventative health care 12/26/2020   Chronic pain of both knees 10/05/2019   Depression, major, single episode, mild (HCC) 10/05/2019   HTN (hypertension) 02/13/2019   Depression, major, single episode, moderate (HCC) 05/16/2017   GERD (gastroesophageal reflux disease) 05/16/2017   Hyperlipidemia 02/06/2013   DEPRESSION, RECURRENT 04/16/2008   Past Medical History:  Diagnosis Date   Depression    GERD (gastroesophageal reflux disease)    Hypertension     Past Surgical History:  Procedure Laterality Date   CARPAL TUNNEL RELEASE      IRRIGATION AND DEBRIDEMENT ELBOW Left 06/16/2021   Procedure: IRRIGATION AND DEBRIDEMENT LEFT OLECRANON BURSA;  Surgeon: Marlyne Beards, MD;  Location: [facility];  Service: Orthopedics;  Laterality: Left;   ULNAR TUNNEL RELEASE Left 06/16/2021   Procedure: LEFT CUBITAL TUNNEL RELEASE;  Surgeon: Marlyne Beards, MD;  Location: [facility];  Service: Orthopedics;  Laterality: Left;    Current Outpatient Medications  Medication Sig Dispense Refill Last Dose   celecoxib (CELEBREX) 100 MG capsule Take 1 capsule (100 mg total) by mouth daily. 30 capsule 2    escitalopram (LEXAPRO) 10 MG tablet TAKE 1 TABLET(10 MG) BY MOUTH DAILY 90 tablet 3    hydrochlorothiazide (HYDRODIURIL) 25 MG tablet Take 1 tablet (25 mg total) by mouth daily. 90 tablet 3    levonorgestrel (MIRENA) 20 MCG/24HR IUD 1 Intra Uterine Device (1 each total) by Intrauterine route once. 1 each 0    losartan (COZAAR) 100 MG tablet TAKE 1 TABLET(100 MG) BY MOUTH DAILY 90 tablet 1    omeprazole (PRILOSEC) 20 MG capsule TAKE 1 CAPSULE(20 MG) BY MOUTH DAILY 90 capsule 3    Polyethylene Glycol 400 0.25 % GEL Place 1 Application into both eyes daily as needed (dry eyes).      rosuvastatin (CRESTOR) 20 MG tablet Take 1 tablet (20 mg total) by mouth daily. 30 tablet 2    Semaglutide-Weight Management (WEGOVY) 0.25 MG/0.5ML SOAJ Inject 0.25 mg into the skin once a week. 2 mL 0    No current facility-administered medications for this visit.   Allergies  Allergen Reactions   Codeine     Unknown reaction    Social History   Tobacco Use   Smoking  status: Some Days    Types: Cigarettes   Smokeless tobacco: Never  Substance Use Topics   Alcohol use: Yes    Comment: 48 oz beer daily    Family History  Problem Relation Age of Onset   Coronary artery disease Other    Diabetes Other    Hyperlipidemia Other    Hypertension Other    Cancer Other        LUNG     Review of Systems  Musculoskeletal:   Positive for arthralgias, gait problem, joint swelling and myalgias.  All other systems reviewed and are negative.   Objective:  Physical Exam Constitutional:      Appearance: Normal appearance.  HENT:     Head: Normocephalic and atraumatic.     Nose: Nose normal.     Mouth/Throat:     Mouth: Mucous membranes are moist.     Pharynx: Oropharynx is clear.  Eyes:     Extraocular Movements: Extraocular movements intact.  Cardiovascular:     Rate and Rhythm: Normal rate and regular rhythm.     Pulses: Normal pulses.     Heart sounds: Normal heart sounds.  Pulmonary:     Effort: Pulmonary effort is normal.     Breath sounds: Normal breath sounds.  Abdominal:     General: Abdomen is flat.     Palpations: Abdomen is soft.  Genitourinary:    Comments: deferred Musculoskeletal:     Cervical back: Normal range of motion and neck supple.     Comments: Examination of the right knee reveals no skin wounds or erythema. Healed incisions from ACL reconstruction. Tenderness to palpation about the medial joint line of the right knee. AROM 10-120 degrees with a mild flexion contracture. Moderate crepitus with passive motion. No effusion noted. No instability. Moderate patellofemoral crepitus.  Sensory and motor function intact in LE bilaterally. Distal pedal pulses 2+ bilaterally.   No pedal edema. Calves soft and non-tender.   Skin:    General: Skin is warm and dry.     Capillary Refill: Capillary refill takes less than 2 seconds.  Neurological:     General: No focal deficit present.     Mental Status: She is alert and oriented to person, place, and time.  Psychiatric:        Mood and Affect: Mood normal.        Behavior: Behavior normal.        Thought Content: Thought content normal.        Judgment: Judgment normal.     Vital signs in last 24 hours: @VSRANGES@  Labs:   Estimated body mass index is 32.39 kg/m as calculated from the following:   Height as of 02/09/22: 5' 8"  (1.727 m).   Weight as of 02/09/22: 96.6 kg.   Imaging Review Plain radiographs demonstrate severe degenerative joint disease of the right knee(s). The overall alignment ismild varus. The bone quality appears to be adequate for age and reported activity level.      Assessment/Plan:  End stage traumatic arthritis, right knee   The patient history, physical examination, clinical judgment of the provider and imaging studies are consistent with end stage degenerative joint disease of the right knee(s) and total knee arthroplasty is deemed medically necessary. The treatment options including medical management, injection therapy arthroscopy and arthroplasty were discussed at length. The risks and benefits of total knee arthroplasty were presented and reviewed. The risks due to aseptic loosening, infection, stiffness, patella tracking problems, thromboembolic complications and   other imponderables were discussed. The patient acknowledged the explanation, agreed to proceed with the plan and consent was signed. Patient is being admitted for inpatient treatment for surgery, pain control, PT, OT, prophylactic antibiotics, VTE prophylaxis, progressive ambulation and ADL's and discharge planning. The patient is planning to be discharged home with OPPT.   Therapy Plans: outpatient therapy EO Disposition: Home with husband Planned DVT Prophylaxis: aspirin 81mg  BID DME needed: Has rolling walker. Wants iceman today.  PCP: Cleared TXA: IV Allergies: NDKA Anesthesia Concerns: None.  BMI: 33 Last HgbA1c: Not diabetic.  Other: - Cr. 0.79, Hgb 13.0, K+ 4.6.  - Rt knee ACL reconstruction and scope 2006.  - ACL injury left knee 2019 - Celebrex - stopped taking due to stomach pain.  - Oxycodone, zofran, methocarbamol.    Patient's anticipated LOS is less than 2 midnights, meeting these requirements: - Younger than 25 - Lives within 1 hour of care - Has a competent adult at home to recover with post-op  recover - NO history of  - Chronic pain requiring opiods  - Diabetes  - Coronary Artery Disease  - Heart failure  - Heart attack  - Stroke  - DVT/VTE  - Cardiac arrhythmia  - Respiratory Failure/COPD  - Renal failure  - Anemia  - Advanced Liver disease

## 2022-02-14 NOTE — Patient Instructions (Signed)
DUE TO SPACE LIMITATIONS, ONLY TWO VISITORS  (aged 52 and older) ARE ALLOWED TO COME WITH YOU AND STAY IN THE WAITING ROOM DURING YOUR PRE OP AND PROCEDURE.   **NO VISITORS ARE ALLOWED IN THE SHORT STAY AREA OR RECOVERY ROOM!!**   You are not required to quarantine at this time prior to your surgery. However, you must do this: Hand Hygiene often Do NOT share personal items Notify your provider if you are in close contact with someone who has COVID or you develop fever 100.4 or greater, new onset of sneezing, cough, sore throat, shortness of breath or body aches.       Your procedure is scheduled on:  Thursday  February 26, 2022  Report to [facility] Main Entrance.  Report to admitting at: 08:30    AM  +++++Call this number if you have any questions or problems the morning of surgery [phone]  Do not eat food :After Midnight the night prior to your surgery/procedure.  After Midnight you may have the following liquids until  08:00 AM DAY OF SURGERY  Clear Liquid Diet Water Black Coffee (sugar ok, NO MILK/CREAM OR CREAMERS)  Tea (sugar ok, NO MILK/CREAM OR CREAMERS) regular and decaf                             Plain Jell-O (NO RED)                                           Fruit ices (not with fruit pulp, NO RED)                                     Popsicles (NO RED)                                                                  Juice: apple, WHITE grape, WHITE cranberry Sports drinks like Gatorade (NO RED)                   The day of surgery:  Drink ONE (1) Pre-Surgery Clear Ensure at    08:00 AM the morning of surgery. Drink in one sitting. Do not sip.  This drink was given to you during your hospital pre-op appointment visit. Nothing else to drink after completing the Pre-Surgery Clear Ensure    FOLLOW ANY ADDITIONAL PRE OP INSTRUCTIONS YOU RECEIVED FROM YOUR SURGEON'S OFFICE!!!   Oral Hygiene is also important to reduce your risk of infection.         Remember - BRUSH YOUR TEETH THE MORNING OF SURGERY WITH YOUR REGULAR TOOTHPASTE  Do NOT smoke after Midnight the night before surgery.  Take ONLY these medicines the morning of surgery with A SIP OF WATER: Escitalopram (Lexapro), Omeprazole (Prilosec)                  You may not have any metal on your body including hair pins, jewelry, and body piercing  Do not wear make-up, lotions, powders, perfumes or deodorant  Do not  wear nail polish including gel and S&S, artificial / acrylic nails, or any other type of covering on natural nails including finger and toenails. If you have artificial nails, gel coating, etc., that needs to be removed by a nail salon, Please have this removed prior to surgery. Not doing so may mean that your surgery could be cancelled or delayed if the Surgeon or anesthesia staff feels like they are unable to monitor you safely.   Do not shave 48 hours prior to surgery to avoid nicks in your skin which may contribute to postoperative infections.   Contacts, Hearing Aids, dentures or bridgework may not be worn into surgery.   DO NOT BRING YOUR HOME MEDICATIONS TO THE HOSPITAL. PHARMACY WILL DISPENSE MEDICATIONS LISTED ON YOUR MEDICATION LIST TO YOU DURING YOUR ADMISSION IN THE HOSPITAL!   Patients discharged on the day of surgery will not be allowed to drive home.  Someone NEEDS to stay with you for the first 24 hours after anesthesia.  Special Instructions: Bring a copy of your healthcare power of attorney and living will documents the day of surgery, if you wish to have them scanned into your [facility] Records- EPIC  Please read over the following fact sheets you were given: IF YOU HAVE QUESTIONS ABOUT YOUR PRE-OP INSTRUCTIONS, PLEASE CALL [phone]  (KAY)   [facility] - Preparing for Surgery Before surgery, you can play an important role.  Because skin is not sterile, your skin needs to be as free of germs as possible.  You can reduce the number of  germs on your skin by washing with CHG (chlorahexidine gluconate) soap before surgery.  CHG is an antiseptic cleaner which kills germs and bonds with the skin to continue killing germs even after washing. Please DO NOT use if you have an allergy to CHG or antibacterial soaps.  If your skin becomes reddened/irritated stop using the CHG and inform your nurse when you arrive at Short Stay. Do not shave (including legs and underarms) for at least 48 hours prior to the first CHG shower.  You may shave your face/neck.  Please follow these instructions carefully:  1.  Shower with CHG Soap the night before surgery and the  morning of surgery.  2.  If you choose to wash your hair, wash your hair first as usual with your normal  shampoo.  3.  After you shampoo, rinse your hair and body thoroughly to remove the shampoo.                             4.  Use CHG as you would any other liquid soap.  You can apply chg directly to the skin and wash.  Gently with a scrungie or clean washcloth.  5.  Apply the CHG Soap to your body ONLY FROM THE NECK DOWN.   Do not use on face/ open                           Wound or open sores. Avoid contact with eyes, ears mouth and genitals (private parts).                       Wash face,  Genitals (private parts) with your normal soap.             6.  Wash thoroughly, paying special attention to the area where your  surgery  will be performed.  7.  Thoroughly rinse your body with warm water from the neck down.  8.  DO NOT shower/wash with your normal soap after using and rinsing off the CHG Soap.            9.  Pat yourself dry with a clean towel.            10.  Wear clean pajamas.            11.  Place clean sheets on your bed the night of your first shower and do not  sleep with pets.  ON THE DAY OF SURGERY : Do not apply any lotions/deodorants the morning of surgery.  Please wear clean clothes to the hospital/surgery center.    FAILURE TO FOLLOW THESE INSTRUCTIONS MAY  RESULT IN THE CANCELLATION OF YOUR SURGERY  PATIENT SIGNATURE_________________________________  NURSE SIGNATURE__________________________________  ________________________________________________________________________   Rogelia Mire    An incentive spirometer is a tool that can help keep your lungs clear and active. This tool measures how well you are filling your lungs with each breath. Taking long deep breaths may help reverse or decrease the chance of developing breathing (pulmonary) problems (especially infection) following: A long period of time when you are unable to move or be active. BEFORE THE PROCEDURE  If the spirometer includes an indicator to show your best effort, your nurse or respiratory therapist will set it to a desired goal. If possible, sit up straight or lean slightly forward. Try not to slouch. Hold the incentive spirometer in an upright position. INSTRUCTIONS FOR USE  Sit on the edge of your bed if possible, or sit up as far as you can in bed or on a chair. Hold the incentive spirometer in an upright position. Breathe out normally. Place the mouthpiece in your mouth and seal your lips tightly around it. Breathe in slowly and as deeply as possible, raising the piston or the ball toward the top of the column. Hold your breath for 3-5 seconds or for as long as possible. Allow the piston or ball to fall to the bottom of the column. Remove the mouthpiece from your mouth and breathe out normally. Rest for a few seconds and repeat Steps 1 through 7 at least 10 times every 1-2 hours when you are awake. Take your time and take a few normal breaths between deep breaths. The spirometer may include an indicator to show your best effort. Use the indicator as a goal to work toward during each repetition. After each set of 10 deep breaths, practice coughing to be sure your lungs are clear. If you have an incision (the cut made at the time of surgery), support your  incision when coughing by placing a pillow or rolled up towels firmly against it. Once you are able to get out of bed, walk around indoors and cough well. You may stop using the incentive spirometer when instructed by your caregiver.  RISKS AND COMPLICATIONS Take your time so you do not get dizzy or light-headed. If you are in pain, you may need to take or ask for pain medication before doing incentive spirometry. It is harder to take a deep breath if you are having pain. AFTER USE Rest and breathe slowly and easily. It can be helpful to keep track of a log of your progress. Your caregiver can provide you with a simple table to help with this. If you are using the spirometer at home, follow these instructions: [facility] MEDICAL CARE  IF:  You are having difficultly using the spirometer. You have trouble using the spirometer as often as instructed. Your pain medication is not giving enough relief while using the spirometer. You develop fever of 100.5 F (38.1 C) or higher.                                                                                                    SEEK IMMEDIATE MEDICAL CARE IF:  You cough up bloody sputum that had not been present before. You develop fever of 102 F (38.9 C) or greater. You develop worsening pain at or near the incision site. MAKE SURE YOU:  Understand these instructions. Will watch your condition. Will get help right away if you are not doing well or get worse. Document Released: 10/26/2006 Document Revised: 09/07/2011 Document Reviewed: 12/27/2006 [facility] Patient Information 2014 [place], [place].

## 2022-02-14 NOTE — Progress Notes (Signed)
COVID Vaccine received:  []  No [x]  Yes Date of any COVID positive Test in last 90 days:  PCP - DO  Medical clearance 01-27-22 note, on chart Cardiologist - none  Chest x-ray -  EKG -  01-27-22  EPIC Stress Test -  ECHO -  Cardiac Cath -   Pacemaker/ICD device     [x]  N/A Spinal Cord Stimulator:[x]  No []  Yes   Other Implants:   History of Sleep Apnea? [x]  No []  Yes   Sleep Study Date:   CPAP used?- [x]  No []  Yes  (Instruct to bring their mask & Tubing)  Does the patient monitor blood sugar? []  No []  Yes  [x]  N/A  Blood Thinner Instructions:  none Aspirin Instructions: Last Dose:  ERAS Protocol Ordered: []  No  [x]  Yes PRE-SURGERY [x]  ENSURE  []  G2   Comments: Smoke occasionally,   Activity level: Patient can climb a flight of stairs without difficulty;  [x]  No CP  [x]  No SOB,  but would have _knee pain   Anesthesia review: HTN  Patient denies shortness of breath, fever, cough and chest pain at PAT appointment.  Patient verbalized understanding and agreement to the Pre-Surgical Instructions that were given to them at this PAT appointment. Patient was also educated of the need to review these PAT instructions again prior to his/her surgery.I reviewed the appropriate phone numbers to call if they have any and questions or concerns.

## 2022-02-17 ENCOUNTER — Encounter (HOSPITAL_COMMUNITY): Payer: Self-pay

## 2022-02-17 ENCOUNTER — Other Ambulatory Visit: Payer: Self-pay

## 2022-02-17 ENCOUNTER — Encounter (HOSPITAL_COMMUNITY)
Admission: RE | Admit: 2022-02-17 | Discharge: 2022-02-17 | Disposition: A | Discharge status: Home / Self Care | Payer: BC Managed Care – PPO | Source: Ambulatory Visit | Attending: Orthopedic Surgery | Admitting: Orthopedic Surgery

## 2022-02-17 VITALS — BP 131/82 | HR 90 | Temp 99.2°F | Resp 16 | Ht 67.0 in | Wt 208.0 lb

## 2022-02-17 DIAGNOSIS — I10 Essential (primary) hypertension: Secondary | ICD-10-CM | POA: Diagnosis not present

## 2022-02-17 DIAGNOSIS — Z01818 Encounter for other preprocedural examination: Secondary | ICD-10-CM

## 2022-02-17 DIAGNOSIS — Z01812 Encounter for preprocedural laboratory examination: Secondary | ICD-10-CM | POA: Diagnosis not present

## 2022-02-17 HISTORY — DX: Unspecified osteoarthritis, unspecified site: M19.90

## 2022-02-17 LAB — SURGICAL PCR SCREEN
MRSA, PCR: NEGATIVE
Staphylococcus aureus: POSITIVE — AB

## 2022-02-17 LAB — BASIC METABOLIC PANEL
Anion gap: 9 (ref 5–15)
BUN: 19 mg/dL (ref 6–20)
CO2: 26 mmol/L (ref 22–32)
Calcium: 9 mg/dL (ref 8.9–10.3)
Chloride: 104 mmol/L (ref 98–111)
Creatinine, Ser: 0.77 mg/dL (ref 0.44–1.00)
GFR, Estimated: >60 mL/min (ref >60)
Glucose, Bld: 105 mg/dL — ABNORMAL HIGH (ref 70–99)
Potassium: 3.9 mmol/L (ref 3.5–5.1)
Sodium: 139 mmol/L (ref 135–145)

## 2022-02-17 LAB — CBC
HCT: 36 % (ref 36.0–46.0)
Hemoglobin: 12.1 g/dL (ref 12.0–15.0)
MCH: 30.9 pg (ref 26.0–34.0)
MCHC: 33.6 g/dL (ref 30.0–36.0)
MCV: 92.1 fL (ref 80.0–100.0)
Platelets: 322 10*3/uL (ref 150–400)
RBC: 3.91 MIL/uL (ref 3.87–5.11)
RDW: 12.4 % (ref 11.5–15.5)
WBC: 7.8 10*3/uL (ref 4.0–10.5)
nRBC: 0 % (ref 0.0–0.2)

## 2022-02-18 NOTE — Progress Notes (Signed)
Patient's PCR screen is positive for ST[facility]. Appropriate notes have been placed on the patient's chart. This note has been routed to Dr. Linna Caprice for review. The Patient's surgery is currently Scheduled for:  02-26-2022  Rudean Haskell, BSN, CVRN-BC   Pre-Surgical Testing Nurse [facility]  [phone]

## 2022-02-21 ENCOUNTER — Other Ambulatory Visit: Payer: Self-pay | Admitting: Family Medicine

## 2022-02-21 DIAGNOSIS — I10 Essential (primary) hypertension: Secondary | ICD-10-CM

## 2022-02-26 ENCOUNTER — Ambulatory Visit (HOSPITAL_COMMUNITY): Payer: BC Managed Care – PPO | Admitting: Certified Registered Nurse Anesthetist

## 2022-02-26 ENCOUNTER — Encounter (HOSPITAL_COMMUNITY): Payer: Self-pay | Admitting: Orthopedic Surgery

## 2022-02-26 ENCOUNTER — Other Ambulatory Visit: Payer: Self-pay

## 2022-02-26 ENCOUNTER — Encounter (HOSPITAL_COMMUNITY)
Admission: RE | Disposition: A | Discharge status: Home / Self Care | Payer: Self-pay | Source: Home / Self Care | Attending: Orthopedic Surgery

## 2022-02-26 ENCOUNTER — Ambulatory Visit (HOSPITAL_COMMUNITY)
Admission: RE | Admit: 2022-02-26 | Discharge: 2022-02-26 | Disposition: A | Discharge status: Home / Self Care | Payer: BC Managed Care – PPO | Attending: Orthopedic Surgery | Admitting: Orthopedic Surgery

## 2022-02-26 ENCOUNTER — Ambulatory Visit (HOSPITAL_COMMUNITY): Payer: BC Managed Care – PPO

## 2022-02-26 DIAGNOSIS — Z96651 Presence of right artificial knee joint: Secondary | ICD-10-CM | POA: Diagnosis not present

## 2022-02-26 DIAGNOSIS — Z79899 Other long term (current) drug therapy: Secondary | ICD-10-CM | POA: Diagnosis not present

## 2022-02-26 DIAGNOSIS — M1711 Unilateral primary osteoarthritis, right knee: Secondary | ICD-10-CM | POA: Diagnosis not present

## 2022-02-26 DIAGNOSIS — K219 Gastro-esophageal reflux disease without esophagitis: Secondary | ICD-10-CM | POA: Insufficient documentation

## 2022-02-26 DIAGNOSIS — T797XXA Traumatic subcutaneous emphysema, initial encounter: Secondary | ICD-10-CM | POA: Diagnosis not present

## 2022-02-26 DIAGNOSIS — G8918 Other acute postprocedural pain: Secondary | ICD-10-CM | POA: Diagnosis not present

## 2022-02-26 DIAGNOSIS — F17200 Nicotine dependence, unspecified, uncomplicated: Secondary | ICD-10-CM | POA: Insufficient documentation

## 2022-02-26 DIAGNOSIS — F32A Depression, unspecified: Secondary | ICD-10-CM | POA: Insufficient documentation

## 2022-02-26 DIAGNOSIS — Z01818 Encounter for other preprocedural examination: Secondary | ICD-10-CM

## 2022-02-26 DIAGNOSIS — I10 Essential (primary) hypertension: Secondary | ICD-10-CM | POA: Insufficient documentation

## 2022-02-26 DIAGNOSIS — M1731 Unilateral post-traumatic osteoarthritis, right knee: Secondary | ICD-10-CM | POA: Diagnosis not present

## 2022-02-26 DIAGNOSIS — Z471 Aftercare following joint replacement surgery: Secondary | ICD-10-CM | POA: Diagnosis not present

## 2022-02-26 HISTORY — PX: KNEE ARTHROPLASTY: SHX992

## 2022-02-26 LAB — POCT PREGNANCY, URINE: Preg Test, Ur: NEGATIVE

## 2022-02-26 SURGERY — ARTHROPLASTY, KNEE, TOTAL, USING IMAGELESS COMPUTER-ASSISTED NAVIGATION
Anesthesia: Spinal | Site: Knee | Laterality: Right

## 2022-02-26 MED ORDER — ISOPROPYL ALCOHOL 70 % SOLN
Status: DC | PRN
  Administered 2022-02-26: 1 via TOPICAL

## 2022-02-26 MED ORDER — OXYCODONE HCL 5 MG PO TABS: 5.0000 mg | ORAL_TABLET | ORAL | Status: DC | PRN

## 2022-02-26 MED ORDER — KETOROLAC TROMETHAMINE 30 MG/ML IJ SOLN
INTRAMUSCULAR | Status: AC
  Filled 2022-02-26: qty 1

## 2022-02-26 MED ORDER — HYDROMORPHONE HCL 1 MG/ML IJ SOLN: 0.5000 mg | INTRAMUSCULAR | Status: DC | PRN

## 2022-02-26 MED ORDER — ISOPROPYL ALCOHOL 70 % SOLN
Status: AC
  Filled 2022-02-26: qty 480

## 2022-02-26 MED ORDER — BUPIVACAINE-EPINEPHRINE 0.25% -1:200000 IJ SOLN
INTRAMUSCULAR | Status: DC | PRN
  Administered 2022-02-26: 30 mL

## 2022-02-26 MED ORDER — LIDOCAINE HCL (PF) 2 % IJ SOLN
INTRAMUSCULAR | Status: AC
  Filled 2022-02-26: qty 5

## 2022-02-26 MED ORDER — POVIDONE-IODINE 10 % EX SWAB: 2.0000 | Freq: Once | CUTANEOUS | Status: DC

## 2022-02-26 MED ORDER — LIDOCAINE HCL (CARDIAC) PF 100 MG/5ML IV SOSY
PREFILLED_SYRINGE | INTRAVENOUS | Status: DC | PRN
  Administered 2022-02-26: 40 mg via INTRAVENOUS

## 2022-02-26 MED ORDER — CEFAZOLIN SODIUM-DEXTROSE 2-4 GM/100ML-% IV SOLN
2.0000 g | Freq: Four times a day (QID) | INTRAVENOUS | Status: DC
  Administered 2022-02-26: 2 g via INTRAVENOUS

## 2022-02-26 MED ORDER — LACTATED RINGERS IV SOLN: INTRAVENOUS | Status: DC

## 2022-02-26 MED ORDER — MIDAZOLAM HCL 2 MG/2ML IJ SOLN
INTRAMUSCULAR | Status: AC
  Filled 2022-02-26: qty 2

## 2022-02-26 MED ORDER — OXYCODONE HCL 5 MG PO TABS
ORAL_TABLET | ORAL | Status: AC
  Filled 2022-02-26: qty 1

## 2022-02-26 MED ORDER — ONDANSETRON HCL 4 MG/2ML IJ SOLN: 4.0000 mg | Freq: Four times a day (QID) | INTRAMUSCULAR | Status: DC | PRN

## 2022-02-26 MED ORDER — MIDAZOLAM HCL 2 MG/2ML IJ SOLN
INTRAMUSCULAR | Status: DC | PRN
  Administered 2022-02-26: 2 mg via INTRAVENOUS

## 2022-02-26 MED ORDER — DEXAMETHASONE SODIUM PHOSPHATE 10 MG/ML IJ SOLN
INTRAMUSCULAR | Status: DC | PRN
  Administered 2022-02-26: 5 mg via INTRAVENOUS

## 2022-02-26 MED ORDER — PROPOFOL 500 MG/50ML IV EMUL
INTRAVENOUS | Status: DC | PRN
  Administered 2022-02-26: 80 ug/kg/min via INTRAVENOUS

## 2022-02-26 MED ORDER — FENTANYL CITRATE (PF) 100 MCG/2ML IJ SOLN
INTRAMUSCULAR | Status: DC | PRN
  Administered 2022-02-26 (×2): 50 ug via INTRAVENOUS

## 2022-02-26 MED ORDER — HYDROMORPHONE HCL 1 MG/ML IJ SOLN: 0.2500 mg | INTRAMUSCULAR | Status: DC | PRN

## 2022-02-26 MED ORDER — KETOROLAC TROMETHAMINE 30 MG/ML IJ SOLN
INTRAMUSCULAR | Status: DC | PRN
  Administered 2022-02-26: 30 mg via INTRAVENOUS

## 2022-02-26 MED ORDER — METHOCARBAMOL 500 MG PO TABS: 500.0000 mg | ORAL_TABLET | Freq: Four times a day (QID) | ORAL | Status: DC | PRN

## 2022-02-26 MED ORDER — METHOCARBAMOL 500 MG IVPB - SIMPLE MED
500.0000 mg | Freq: Four times a day (QID) | INTRAVENOUS | Status: DC | PRN
  Administered 2022-02-26: 500 mg via INTRAVENOUS

## 2022-02-26 MED ORDER — ACETAMINOPHEN 500 MG PO TABS: 1000.0000 mg | ORAL_TABLET | Freq: Three times a day (TID) | ORAL | 0 refills | Status: AC

## 2022-02-26 MED ORDER — ASPIRIN 81 MG PO CHEW
81.0000 mg | CHEWABLE_TABLET | Freq: Two times a day (BID) | ORAL | 0 refills | Status: AC
Start: 2022-02-26 — End: 2022-04-12

## 2022-02-26 MED ORDER — METOCLOPRAMIDE HCL 5 MG/ML IJ SOLN: 5.0000 mg | Freq: Three times a day (TID) | INTRAMUSCULAR | Status: DC | PRN

## 2022-02-26 MED ORDER — OXYCODONE HCL 5 MG PO TABS
10.0000 mg | ORAL_TABLET | ORAL | Status: DC | PRN
  Administered 2022-02-26: 10 mg via ORAL

## 2022-02-26 MED ORDER — PROPOFOL 10 MG/ML IV BOLUS
INTRAVENOUS | Status: DC | PRN
  Administered 2022-02-26 (×3): 20 mg via INTRAVENOUS

## 2022-02-26 MED ORDER — ONDANSETRON HCL 4 MG PO TABS: 4.0000 mg | ORAL_TABLET | Freq: Three times a day (TID) | ORAL | 0 refills | Status: DC | PRN

## 2022-02-26 MED ORDER — ORAL CARE MOUTH RINSE: 15.0000 mL | Freq: Once | OROMUCOSAL | Status: DC

## 2022-02-26 MED ORDER — TRANEXAMIC ACID-NACL 1000-0.7 MG/100ML-% IV SOLN
1000.0000 mg | INTRAVENOUS | Status: AC
  Administered 2022-02-26: 1000 mg via INTRAVENOUS
  Filled 2022-02-26: qty 100

## 2022-02-26 MED ORDER — ONDANSETRON HCL 4 MG PO TABS: 4.0000 mg | ORAL_TABLET | Freq: Four times a day (QID) | ORAL | Status: DC | PRN

## 2022-02-26 MED ORDER — ONDANSETRON HCL 4 MG/2ML IJ SOLN
INTRAMUSCULAR | Status: DC | PRN
  Administered 2022-02-26: 4 mg via INTRAVENOUS

## 2022-02-26 MED ORDER — METOCLOPRAMIDE HCL 5 MG PO TABS: 5.0000 mg | ORAL_TABLET | Freq: Three times a day (TID) | ORAL | Status: DC | PRN

## 2022-02-26 MED ORDER — ACETAMINOPHEN 500 MG PO TABS
1000.0000 mg | ORAL_TABLET | Freq: Once | ORAL | Status: AC
  Administered 2022-02-26: 1000 mg via ORAL

## 2022-02-26 MED ORDER — SENNA 8.6 MG PO TABS: 2.0000 | ORAL_TABLET | Freq: Every day | ORAL | 1 refills | Status: AC

## 2022-02-26 MED ORDER — BUPIVACAINE IN DEXTROSE 0.75-8.25 % IT SOLN
INTRATHECAL | Status: DC | PRN
  Administered 2022-02-26: 2 mL via INTRATHECAL

## 2022-02-26 MED ORDER — POLYETHYLENE GLYCOL 3350 17 G PO PACK: 17.0000 g | PACK | Freq: Every day | ORAL | 0 refills | Status: DC | PRN

## 2022-02-26 MED ORDER — LACTATED RINGERS IV BOLUS
500.0000 mL | Freq: Once | INTRAVENOUS | Status: AC
Start: 2022-02-26 — End: 2022-02-26
  Administered 2022-02-26: 500 mL via INTRAVENOUS

## 2022-02-26 MED ORDER — BUPIVACAINE-EPINEPHRINE (PF) 0.5% -1:200000 IJ SOLN
INTRAMUSCULAR | Status: DC | PRN
  Administered 2022-02-26: 20 mL via PERINEURAL

## 2022-02-26 MED ORDER — CELECOXIB 200 MG PO CAPS: 200.0000 mg | ORAL_CAPSULE | Freq: Every day | ORAL | 3 refills | Status: DC

## 2022-02-26 MED ORDER — PROPOFOL 10 MG/ML IV BOLUS
INTRAVENOUS | Status: AC
  Filled 2022-02-26: qty 20

## 2022-02-26 MED ORDER — CELECOXIB 200 MG PO CAPS: 200.0000 mg | ORAL_CAPSULE | Freq: Two times a day (BID) | ORAL | Status: DC

## 2022-02-26 MED ORDER — CEFAZOLIN SODIUM-DEXTROSE 2-4 GM/100ML-% IV SOLN
INTRAVENOUS | Status: AC
  Filled 2022-02-26: qty 100

## 2022-02-26 MED ORDER — PROPOFOL 1000 MG/100ML IV EMUL
INTRAVENOUS | Status: AC
  Filled 2022-02-26: qty 100

## 2022-02-26 MED ORDER — BUPIVACAINE-EPINEPHRINE (PF) 0.25% -1:200000 IJ SOLN
INTRAMUSCULAR | Status: AC
  Filled 2022-02-26: qty 30

## 2022-02-26 MED ORDER — FENTANYL CITRATE (PF) 100 MCG/2ML IJ SOLN
INTRAMUSCULAR | Status: AC
  Filled 2022-02-26: qty 2

## 2022-02-26 MED ORDER — METHOCARBAMOL 500 MG IVPB - SIMPLE MED
INTRAVENOUS | Status: AC
  Filled 2022-02-26: qty 55

## 2022-02-26 MED ORDER — CEFAZOLIN SODIUM-DEXTROSE 2-4 GM/100ML-% IV SOLN
2.0000 g | INTRAVENOUS | Status: AC
  Administered 2022-02-26: 2 g via INTRAVENOUS
  Filled 2022-02-26: qty 100

## 2022-02-26 MED ORDER — SODIUM CHLORIDE (PF) 0.9 % IJ SOLN
INTRAMUSCULAR | Status: AC
  Filled 2022-02-26: qty 50

## 2022-02-26 MED ORDER — SODIUM CHLORIDE 0.9 % IR SOLN
Status: DC | PRN
  Administered 2022-02-26 (×2): 1000 mL

## 2022-02-26 MED ORDER — CHLORHEXIDINE GLUCONATE 0.12 % MT SOLN: 15.0000 mL | Freq: Once | OROMUCOSAL | Status: DC

## 2022-02-26 MED ORDER — SODIUM CHLORIDE (PF) 0.9 % IJ SOLN
INTRAMUSCULAR | Status: DC | PRN
  Administered 2022-02-26: 30 mL via INTRAVENOUS

## 2022-02-26 MED ORDER — DOCUSATE SODIUM 100 MG PO CAPS: 100.0000 mg | ORAL_CAPSULE | Freq: Two times a day (BID) | ORAL | 1 refills | Status: AC

## 2022-02-26 MED ORDER — ACETAMINOPHEN 325 MG PO TABS: 325.0000 mg | ORAL_TABLET | Freq: Four times a day (QID) | ORAL | Status: DC | PRN

## 2022-02-26 MED ORDER — OXYCODONE HCL 5 MG PO TABS: 5.0000 mg | ORAL_TABLET | ORAL | 0 refills | Status: AC | PRN

## 2022-02-26 MED ORDER — LACTATED RINGERS IV BOLUS
250.0000 mL | Freq: Once | INTRAVENOUS | Status: AC
Start: 2022-02-26 — End: 2022-02-26
  Administered 2022-02-26: 250 mL via INTRAVENOUS

## 2022-02-26 MED ORDER — SODIUM CHLORIDE 0.9 % IV SOLN: INTRAVENOUS | Status: DC

## 2022-02-26 SURGICAL SUPPLY — 75 items
ADH SKN CLS APL DERMABOND .7 (GAUZE/BANDAGES/DRESSINGS) ×2
APL PRP STRL LF DISP 70% ISPRP (MISCELLANEOUS) ×2
BAG COUNTER SPONGE SURGICOUNT (BAG) IMPLANT
BAG SPEC THK2 15X12 ZIP CLS (MISCELLANEOUS)
BAG SPNG CNTER NS LX DISP (BAG)
BAG ZIPLOCK 12X15 (MISCELLANEOUS) IMPLANT
BATTERY INSTRU NAVIGATION (MISCELLANEOUS) ×3 IMPLANT
BLADE SAW RECIPROCATING 77.5 (BLADE) ×1 IMPLANT
BNDG CMPR MED 10X6 ELC LF (GAUZE/BANDAGES/DRESSINGS) ×1
BNDG ELASTIC 4X5.8 VLCR STR LF (GAUZE/BANDAGES/DRESSINGS) ×1 IMPLANT
BNDG ELASTIC 6X10 VLCR STRL LF (GAUZE/BANDAGES/DRESSINGS) IMPLANT
BNDG ELASTIC 6X5.8 VLCR STR LF (GAUZE/BANDAGES/DRESSINGS) ×1 IMPLANT
BTRY SRG DRVR LF (MISCELLANEOUS) ×3
CAST PADDING STERILE 6X4 (CAST SUPPLIES) IMPLANT
CHLORAPREP W/TINT 26 (MISCELLANEOUS) ×2 IMPLANT
COMP FEM STD PS 8 RT (Joint) ×1 IMPLANT
COMP PATELLAR 10X35 METAL (Joint) ×1 IMPLANT
COMPONENT FEM STD PS 8 RT (Joint) IMPLANT
COMPONENT PATELLAR 10X35 METAL (Joint) IMPLANT
COVER SURGICAL LIGHT HANDLE (MISCELLANEOUS) ×1 IMPLANT
DERMABOND IMPLANT
DERMABOND ADVANCED (GAUZE/BANDAGES/DRESSINGS) ×2
DERMABOND ADVANCED .7 DNX12 (GAUZE/BANDAGES/DRESSINGS) ×2 IMPLANT
DRAPE INCISE IOBAN 66X45 STRL (DRAPES) ×1 IMPLANT
DRAPE SHEET LG 3/4 BI-LAMINATE (DRAPES) ×3 IMPLANT
DRAPE U-SHAPE 47X51 STRL (DRAPES) ×1 IMPLANT
DRESSING AQUACEL AG SP 3.5X10 (GAUZE/BANDAGES/DRESSINGS) IMPLANT
DRSG AQUACEL AG ADV 3.5X10 (GAUZE/BANDAGES/DRESSINGS) ×1 IMPLANT
DRSG AQUACEL AG SP 3.5X10 (GAUZE/BANDAGES/DRESSINGS) ×1
ELECT BLADE TIP CTD 4 INCH (ELECTRODE) ×1 IMPLANT
ELECT REM PT RETURN 15FT ADLT (MISCELLANEOUS) ×1 IMPLANT
GAUZE SPONGE 4X4 12PLY STRL (GAUZE/BANDAGES/DRESSINGS) ×1 IMPLANT
GLOVE BIO SURGEON STRL SZ7 (GLOVE) ×1 IMPLANT
GLOVE BIO SURGEON STRL SZ8.5 (GLOVE) ×2 IMPLANT
GLOVE BIOGEL PI IND STRL 7.5 (GLOVE) ×1 IMPLANT
GLOVE BIOGEL PI IND STRL 8.5 (GLOVE) ×1 IMPLANT
GLOVE BIOGEL PI INDICATOR 7.5 (GLOVE) ×1
GLOVE BIOGEL PI INDICATOR 8.5 (GLOVE) ×1
GOWN SPEC L3 XXLG W/TWL (GOWN DISPOSABLE) ×1 IMPLANT
GOWN STRL REUS W/ TWL XL LVL3 (GOWN DISPOSABLE) ×1 IMPLANT
GOWN STRL REUS W/TWL XL LVL3 (GOWN DISPOSABLE) ×1
HANDPIECE INTERPULSE COAX TIP (DISPOSABLE) ×1
HOLDER FOLEY CATH W/STRAP (MISCELLANEOUS) ×1 IMPLANT
HOOD PEEL AWAY FLYTE STAYCOOL (MISCELLANEOUS) ×3 IMPLANT
INSERT TIB AS PS EF/3-11 10 RT (Insert) IMPLANT
KIT TURNOVER KIT A (KITS) IMPLANT
MARKER SKIN DUAL TIP RULER LAB (MISCELLANEOUS) ×1 IMPLANT
NDL SAFETY ECLIP 18X1.5 (MISCELLANEOUS) ×1 IMPLANT
NDL SPNL 18GX3.5 QUINCKE PK (NEEDLE) ×1 IMPLANT
NEEDLE SPNL 18GX3.5 QUINCKE PK (NEEDLE) ×1 IMPLANT
NS IRRIG 1000ML POUR BTL (IV SOLUTION) ×1 IMPLANT
PACK TOTAL KNEE CUSTOM (KITS) ×1 IMPLANT
PADDING CAST COTTON 6X4 STRL (CAST SUPPLIES) ×1 IMPLANT
PROS TIB KNEE PS 0D F RT (Joint) ×1 IMPLANT
PROSTHESIS TIB KNEE PS 0D F RT (Joint) IMPLANT
PROTECTOR NERVE ULNAR (MISCELLANEOUS) ×1 IMPLANT
SAW OSC TIP CART 19.5X105X1.3 (SAW) ×1 IMPLANT
SEALER BIPOLAR AQUA 6.0 (INSTRUMENTS) ×1 IMPLANT
SET HNDPC FAN SPRY TIP SCT (DISPOSABLE) ×1 IMPLANT
SET PAD KNEE POSITIONER (MISCELLANEOUS) ×1 IMPLANT
SOLUTION PRONTOSAN WOUND 350ML (IRRIGATION / IRRIGATOR) IMPLANT
SPIKE FLUID TRANSFER (MISCELLANEOUS) ×2 IMPLANT
SUT MNCRL AB 3-0 PS2 18 (SUTURE) ×1 IMPLANT
SUT MNCRL AB 4-0 PS2 18 (SUTURE) IMPLANT
SUT MON AB 2-0 CT1 36 (SUTURE) ×1 IMPLANT
SUT STRATAFIX PDO 1 14 VIOLET (SUTURE) ×1
SUT STRATFX PDO 1 14 VIOLET (SUTURE) ×1
SUT VIC AB 1 CTX 36 (SUTURE) ×2
SUT VIC AB 1 CTX36XBRD ANBCTR (SUTURE) ×2 IMPLANT
SUT VIC AB 2-0 CT1 27 (SUTURE)
SUT VIC AB 2-0 CT1 TAPERPNT 27 (SUTURE) IMPLANT
SUTURE STRATFX PDO 1 14 VIOLET (SUTURE) ×1 IMPLANT
TRAY FOLEY MTR SLVR 16FR STAT (SET/KITS/TRAYS/PACK) IMPLANT
TUBE SUCTION HIGH CAP CLEAR NV (SUCTIONS) ×1 IMPLANT
WATER STERILE IRR 1000ML POUR (IV SOLUTION) ×2 IMPLANT

## 2022-02-26 NOTE — Anesthesia Procedure Notes (Signed)
Anesthesia Regional Block: Adductor canal block   Pre-Anesthetic Checklist: , timeout performed,  Correct Patient, Correct Site, Correct Laterality,  Correct Procedure, Correct Position, site marked,  Risks and benefits discussed,  Pre-op evaluation,  At surgeon's request and post-op pain management  Laterality: Right  Prep: Maximum Sterile Barrier Precautions used, chloraprep       Needles:  Injection technique: Single-shot  Needle Type: Echogenic Stimulator Needle     Needle Length: 9cm  Needle Gauge: 21     Additional Needles:   Procedures:,,,, ultrasound used (permanent image in chart),,    Narrative:  Start time: 02/26/2022 6:49 AM End time: 02/26/2022 6:59 AM Injection made incrementally with aspirations every 5 mL.  Performed by: Personally  Anesthesiologist: Gaynelle Adu, MD  Additional Notes:

## 2022-02-26 NOTE — Anesthesia Postprocedure Evaluation (Signed)
Anesthesia Post Note  Patient: Kari Lopez  Procedure(s) Performed: COMPUTER ASSISTED TOTAL KNEE ARTHROPLASTY (Right: Knee)     Patient location during evaluation: PACU Anesthesia Type: Spinal and Regional Level of consciousness: oriented and awake and alert Pain management: pain level controlled Vital Signs Assessment: post-procedure vital signs reviewed and stable Respiratory status: spontaneous breathing, respiratory function stable and patient connected to nasal cannula oxygen Cardiovascular status: blood pressure returned to baseline and stable Postop Assessment: no headache, no backache, no apparent nausea or vomiting, spinal receding and patient able to bend at knees Anesthetic complications: no   No notable events documented.  Last Vitals:  Vitals:   02/26/22 1136 02/26/22 1230  BP: (!) 141/97 (!) 146/97  Pulse: 70 78  Resp: 16 16  Temp: 36.6 C   SpO2: 96% 94%    Last Pain:  Vitals:   02/26/22 1230  TempSrc:   PainSc: 3                  Chidiebere Wynn,W. EDMOND

## 2022-02-26 NOTE — Discharge Instructions (Signed)
 Dr. Kenidy Crossland Total Joint Specialist [facility] [address] [place], [place] [address] [phone]  TOTAL KNEE REPLACEMENT POSTOPERATIVE DIRECTIONS    Knee Rehabilitation, Guidelines Following Surgery  Results after knee surgery are often greatly improved when you follow the exercise, range of motion and muscle strengthening exercises prescribed by your doctor. Safety measures are also important to protect the knee from further injury. Any time any of these exercises cause you to have increased pain or swelling in your knee joint, decrease the amount until you are comfortable again and slowly increase them. If you have problems or questions, call your caregiver or physical therapist for advice.   WEIGHT BEARING Weight bearing as tolerated with assist device (walker, cane, etc) as directed, use it as long as suggested by your surgeon or therapist, typically at least 4-6 weeks.  HOME CARE INSTRUCTIONS  Remove items at home which could result in a fall. This includes throw rugs or furniture in walking pathways.  Continue medications as instructed at time of discharge. You may have some home medications which will be placed on hold until you complete the course of blood thinner medication.  You may start showering once you are discharged home but do not submerge the incision under water. Just pat the incision dry and apply a dry gauze dressing on daily. Walk with walker as instructed.  You may resume a sexual relationship in one month or when given the OK by your doctor.  Use walker as long as suggested by your caregivers. Avoid periods of inactivity such as sitting longer than an hour when not asleep. This helps prevent blood clots.  You may put full weight on your legs and walk as much as is comfortable.  You may return to work once you are cleared by your doctor.  Do not drive a car for 6 weeks or until released by you surgeon.  Do not drive while  taking narcotics.  Wear the elastic stockings for three weeks following surgery during the day but you may remove then at night. Make sure you keep all of your appointments after your operation with all of your doctors and caregivers. You should call the office at the above phone number and make an appointment for approximately two weeks after the date of your surgery. Do not remove your surgical dressing. The dressing is waterproof; you may take showers in 3 days, but do not take tub baths or submerge the dressing. Please pick up a stool softener and laxative for home use as long as you are requiring pain medications. ICE to the affected knee every three hours for 30 minutes at a time and then as needed for pain and swelling.  Continue to use ice on the knee for pain and swelling from surgery. You may notice swelling that will progress down to the foot and ankle.  This is normal after surgery.  Elevate the leg when you are not up walking on it.   It is important for you to complete the blood thinner medication as prescribed by your doctor. Continue to use the breathing machine which will help keep your temperature down.  It is common for your temperature to cycle up and down following surgery, especially at night when you are not up moving around and exerting yourself.  The breathing machine keeps your lungs expanded and your temperature down.  RANGE OF MOTION AND STRENGTHENING EXERCISES  Rehabilitation of the knee is important following a knee injury or an   operation. After just a few days of immobilization, the muscles of the thigh which control the knee become weakened and shrink (atrophy). Knee exercises are designed to build up the tone and strength of the thigh muscles and to improve knee motion. Often times heat used for twenty to thirty minutes before working out will loosen up your tissues and help with improving the range of motion but do not use heat for the first two weeks following surgery.  These exercises can be done on a training (exercise) mat, on the floor, on a table or on a bed. Use what ever works the best and is most comfortable for you Knee exercises include:  Leg Lifts - While your knee is still immobilized in a splint or cast, you can do straight leg raises. Lift the leg to 60 degrees, hold for 3 sec, and slowly lower the leg. Repeat 10-20 times 2-3 times daily. Perform this exercise against resistance later as your knee gets better.  Quad and Hamstring Sets - Tighten up the muscle on the front of the thigh (Quad) and hold for 5-10 sec. Repeat this 10-20 times hourly. Hamstring sets are done by pushing the foot backward against an object and holding for 5-10 sec. Repeat as with quad sets.  A rehabilitation program following serious knee injuries can speed recovery and prevent re-injury in the future due to weakened muscles. Contact your doctor or a physical therapist for more information on knee rehabilitation.   POST-OPERATIVE OPIOID TAPER INSTRUCTIONS: It is important to wean off of your opioid medication as soon as possible. If you do not need pain medication after your surgery it is ok to stop day one. Opioids include: Codeine, Hydrocodone(Norco, Vicodin), Oxycodone(Percocet, oxycontin) and hydromorphone amongst others.  Long term and even short term use of opiods can cause: Increased pain response Dependence Constipation Depression Respiratory depression And more.  Withdrawal symptoms can include Flu like symptoms Nausea, vomiting And more Techniques to manage these symptoms Hydrate well Eat regular healthy meals Stay active Use relaxation techniques(deep breathing, meditating, yoga) Do Not substitute Alcohol to help with tapering If you have been on opioids for less than two weeks and do not have pain than it is ok to stop all together.  Plan to wean off of opioids This plan should start within one week post op of your joint replacement. Maintain the same  interval or time between taking each dose and first decrease the dose.  Cut the total daily intake of opioids by one tablet each day Next start to increase the time between doses. The last dose that should be eliminated is the evening dose.    SKILLED REHAB INSTRUCTIONS: If the patient is transferred to a skilled rehab facility following release from the hospital, a list of the current medications will be sent to the facility for the patient to continue.  When discharged from the skilled rehab facility, please have the facility set up the patient's Home Health Physical Therapy prior to being released. Also, the skilled facility will be responsible for providing the patient with their medications at time of release from the facility to include their pain medication, the muscle relaxants, and their blood thinner medication. If the patient is still at the rehab facility at time of the two week follow up appointment, the skilled rehab facility will also need to assist the patient in arranging follow up appointment in our office and any transportation needs.  MAKE SURE YOU:  Understand these instructions.  Will watch   your condition.  Will get help right away if you are not doing well or get worse.    Pick up stool softner and laxative for home use following surgery while on pain medications. Do NOT remove your dressing. You may shower.  Do not take tub baths or submerge incision under water. May shower starting three days after surgery. Please use a clean towel to pat the incision dry following showers. Continue to use ice for pain and swelling after surgery. Do not use any lotions or creams on the incision until instructed by your surgeon.  

## 2022-02-26 NOTE — Anesthesia Preprocedure Evaluation (Addendum)
Anesthesia Evaluation  Patient identified by MRN, date of birth, ID band Patient awake    Reviewed: Allergy & Precautions, H&P , NPO status , Patient's Chart, lab work & pertinent test results  Airway Mallampati: II  TM Distance: >3 FB Neck ROM: Full    Dental no notable dental hx. (+) Teeth Intact, Dental Advisory Given   Pulmonary Current Smoker and Patient abstained from smoking.,    Pulmonary exam normal breath sounds clear to auscultation       Cardiovascular hypertension, Pt. on medications  Rhythm:Regular Rate:Normal     Neuro/Psych Depression negative neurological ROS     GI/Hepatic Neg liver ROS, GERD  Medicated,  Endo/Other  negative endocrine ROS  Renal/GU negative Renal ROS  negative genitourinary   Musculoskeletal  (+) Arthritis , Osteoarthritis,    Abdominal   Peds  Hematology negative hematology ROS (+)   Anesthesia Other Findings   Reproductive/Obstetrics negative OB ROS                            Anesthesia Physical Anesthesia Plan  ASA: 2  Anesthesia Plan: Spinal   Post-op Pain Management: Regional block* and Tylenol PO (pre-op)*   Induction: Intravenous  PONV Risk Score and Plan: 2 and Ondansetron, Dexamethasone, Propofol infusion and Midazolam  Airway Management Planned: Natural Airway and Simple Face Mask  Additional Equipment:   Intra-op Plan:   Post-operative Plan:   Informed Consent: I have reviewed the patients History and Physical, chart, labs and discussed the procedure including the risks, benefits and alternatives for the proposed anesthesia with the patient or authorized representative who has indicated his/her understanding and acceptance.     Dental advisory given  Plan Discussed with: CRNA  Anesthesia Plan Comments:         Anesthesia Quick Evaluation

## 2022-02-26 NOTE — Transfer of Care (Signed)
Immediate Anesthesia Transfer of Care Note  Patient: Kari Lopez  Procedure(s) Performed: COMPUTER ASSISTED TOTAL KNEE ARTHROPLASTY (Right: Knee)  Patient Location: PACU  Anesthesia Type:Spinal  Level of Consciousness: awake, alert , oriented and patient cooperative  Airway & Oxygen Therapy: Patient Spontanous Breathing  Post-op Assessment: Report given to RN and Post -op Vital signs reviewed and stable  Post vital signs: Reviewed and stable  Last Vitals:  Vitals Value Taken Time  BP 136/79 1017  Temp    Pulse 71 1017  Resp 11 1017  SpO2 94% 1017    Last Pain:  Vitals:   02/26/22 0643  TempSrc:   PainSc: 3       Patients Stated Pain Goal: 4 (02/26/22 9937)  Complications: No notable events documented.

## 2022-02-26 NOTE — Op Note (Signed)
OPERATIVE REPORT  SURGEON: Rod Can, MD   ASSISTANT: Larene Pickett, PA-C  PREOPERATIVE DIAGNOSIS: Post traumatic Right knee arthritis.   POSTOPERATIVE DIAGNOSIS: Post traumatic Right knee arthritis.   PROCEDURE: Computer assisted Right total knee arthroplasty.   IMPLANTS: Zimmer Persona PPS Cementless CR femur, size 8. Persona 0 degree Spiked Keel OsseoTi Tibia, size F. Vivacit-E polyethelyene insert, size 10 mm, CR. TM standard patella, size 35 mm.  ANESTHESIA:  MAC, Regional, and Spinal  TOURNIQUET TIME: Not utilized.   ESTIMATED BLOOD LOSS:-100 mL    ANTIBIOTICS: 2g Ancef.  DRAINS: None.  COMPLICATIONS: None   CONDITION: PACU - hemodynamically stable.   BRIEF CLINICAL NOTE: Kari Lopez is a 51 y.o. female with a long-standing history of Right knee arthritis following remote open ACL reconstruction. After failing conservative management, the patient was indicated for total knee arthroplasty. The risks, benefits, and alternatives to the procedure were explained, and the patient elected to proceed.  PROCEDURE IN DETAIL: Adductor canal block was obtained in the pre-op holding area. Once inside the operative room, spinal anesthesia was obtained, and a foley catheter was inserted. The patient was then positioned and the lower extremity was prepped and draped in the normal sterile surgical fashion.  A time-out was called verifying side and site of surgery. The patient received IV antibiotics within 60 minutes of beginning the procedure. A tourniquet was not utilized.   The previous anterior skin incision was excised, and an anterior approach to the knee was performed utilizing a midvastus arthrotomy. A medial release was performed and the patellar fat pad was excised. Stryker imageless navigation was used to cut the distal femur perpendicular to the mechanical axis. A freehand patellar resection was performed, and the patella was sized an prepared with 3 lug holes.  Nagivation  was used to make a neutral proximal tibia resection, taking 3 mm of bone from the less affected lateral side with 3 degrees of slope. The menisci were excised. A spacer block was placed, and the alignment and balance in extension were confirmed.   The distal femur was sized using the 3-degree external rotation guide referencing the posterior femoral cortex. The appropriate 4-in-1 cutting block was pinned into place. Rotation was checked using Whiteside's line, the epicondylar axis, and then confirmed with a spacer block in flexion. The remaining femoral cuts were performed, taking care to protect the MCL.  The tibia was sized and the trial tray was pinned into place. The remaining trail components were inserted. The knee was stable to varus and valgus stress through a full range of motion. The patella tracked centrally, and the PCL was well balanced. The trial components were removed, and the proximal tibial surface was prepared. Final components were impacted into place. The knee was tested for a final time and found to be well balanced.   The wound was copiously irrigated with Prontosan solution and normal saline using pule lavage.  Marcaine solution was injected into the periarticular soft tissue.  The wound was closed in layers using #1 Vicryl and Stratafix for the fascia, 2-0 Vicryl for the subcutaneous fat, 2-0 Monocryl for the deep dermal layer, 3-0 running Monocryl subcuticular Stitch, and 4-0 Monocryl stay sutures at both ends of the wound. Dermabond was applied to the skin.  Once the glue was fully dried, an Aquacell Ag and compressive dressing were applied.  The patient was transported to the recovery room in stable condition.  Sponge, needle, and instrument counts were correct at the end of the  case x2.  The patient tolerated the procedure well and there were no known complications.  Please note that a surgical assistant was a medical necessity for this procedure in order to perform it in a safe  and expeditious manner. Surgical assistant was necessary to retract the ligaments and vital neurovascular structures to prevent injury to them and also necessary for proper positioning of the limb to allow for anatomic placement of the prosthesis.

## 2022-02-26 NOTE — Anesthesia Procedure Notes (Signed)
Spinal  Patient location during procedure: OR Start time: 02/26/2022 7:41 AM End time: 02/26/2022 7:45 AM Staffing Performed: resident/CRNA  Anesthesiologist: Gaynelle Adu, MD Resident/CRNA: Yolonda Kida, CRNA Performed by: Yolonda Kida, CRNA Authorized by: Gaynelle Adu, MD   Preanesthetic Checklist Completed: patient identified, IV checked, site marked, risks and benefits discussed, surgical consent, monitors and equipment checked, pre-op evaluation and timeout performed Spinal Block Patient position: sitting Prep: DuraPrep Patient monitoring: blood pressure, continuous pulse ox and heart rate Approach: midline Location: L3-4 Injection technique: single-shot Needle Needle type: Pencan  Needle gauge: 24 G Assessment Sensory level: T6 Events: CSF return

## 2022-02-26 NOTE — Evaluation (Signed)
Physical Therapy Evaluation Patient Details Name: Kari Lopez MRN: [id number] DOB: Aug 15, 1970 Today's Date: 02/26/2022  History of Present Illness  Pt s/p R TKR  Clinical Impression  Pt s/p R TKR and presents with decreased R LE strength/ROM and post op pain limiting functional mobility.  This date, pt performed HEP with assist and ambulated in hall with RW before negotiating 2 steps but with noted shakiness and instability on stairs.  Will return on arrival of spouse to complete training.     Recommendations for follow up therapy are one component of a multi-disciplinary discharge planning process, led by the attending physician.  Recommendations may be updated based on patient status, additional functional criteria and insurance authorization.  Follow Up Recommendations Follow physician's recommendations for discharge plan and follow up therapies      Assistance Recommended at Discharge Frequent or constant Supervision/Assistance  Patient can return home with the following  A little help with walking and/or transfers;A little help with bathing/dressing/bathroom;Assistance with cooking/housework;Assist for transportation;Help with stairs or ramp for entrance    Equipment Recommendations None recommended by PT  Recommendations for Other Services       Functional Status Assessment Patient has had a recent decline in their functional status and demonstrates the ability to make significant improvements in function in a reasonable and predictable amount of time.     Precautions / Restrictions Precautions Precautions: Fall;Knee Restrictions Weight Bearing Restrictions: No RLE Weight Bearing: Weight bearing as tolerated      Mobility  Bed Mobility Overal bed mobility: Needs Assistance Bed Mobility: Supine to Sit     Supine to sit: Min guard     General bed mobility comments: for safety only    Transfers Overall transfer level: Needs assistance Equipment used: Rolling  walker (2 wheels) Transfers: Sit to/from Stand Sit to Stand: Min guard           General transfer comment: Steady assist with cues for LE management and use of UEs to self assist    Ambulation/Gait Ambulation/Gait assistance: Min assist, Min guard Gait Distance (Feet): 120 Feet Assistive device: Rolling walker (2 wheels) Gait Pattern/deviations: Step-to pattern, Decreased step length - right, Decreased step length - left, Shuffle, Trunk flexed Gait velocity: decr     General Gait Details: cues for sequence, posture and position from RW; Mild buckling noted at R knee but pt compensated well with UEs  Stairs Stairs: Yes Stairs assistance: Mod assist Stair Management: No rails, Step to pattern, Backwards, With walker Number of Stairs: 2 General stair comments: Cues for sequence and foot/RW placement,  Noted shakiness/instabilty - will review with spouse on arrival  Wheelchair Mobility    Modified Rankin (Stroke Patients Only)       Balance Overall balance assessment: Needs assistance Sitting-balance support: No upper extremity supported, Feet supported Sitting balance-Leahy Scale: Good     Standing balance support: Bilateral upper extremity supported Standing balance-Leahy Scale: Poor                               Pertinent Vitals/Pain Pain Assessment Pain Assessment: 0-10 Pain Score: 2  Pain Location: R knee Pain Descriptors / Indicators: Aching, Sore Pain Intervention(s): Limited activity within patient's tolerance, Premedicated before session, Monitored during session    Home Living Family/patient expects to be discharged to:: Private residence Living Arrangements: Spouse/significant other;Children Available Help at Discharge: Family Type of Home: House Home Access: Stairs to enter Entrance Stairs-Rails: None  Entrance Stairs-Number of Steps: 3 Alternate Level Stairs-Number of Steps: flight Home Layout: Two level;Able to live on main level  with bedroom/bathroom Home Equipment: Agricultural consultant (2 wheels)      Prior Function Prior Level of Function : Independent/Modified Independent             Mobility Comments: Worked as Production designer, theatre/television/film at [employer]        Extremity/Trunk Assessment   Upper Extremity Assessment Upper Extremity Assessment: Overall WFL for tasks assessed    Lower Extremity Assessment Lower Extremity Assessment: RLE deficits/detail RLE Deficits / Details: 3/5 quads with IND SLR;  AAROM at knee to 90    Cervical / Trunk Assessment Cervical / Trunk Assessment: Normal  Communication   Communication: No difficulties  Cognition Arousal/Alertness: Awake/alert Behavior During Therapy: WFL for tasks assessed/performed Overall Cognitive Status: Within Functional Limits for tasks assessed                                          General Comments      Exercises Total Joint Exercises Ankle Circles/Pumps: AROM, Both, 20 reps, Supine Quad Sets: AROM, Both, 10 reps, Supine Heel Slides: AAROM, Right, 15 reps, Supine Hip ABduction/ADduction: AROM, Right, 10 reps, Supine Straight Leg Raises: AAROM, AROM, Right, 15 reps, Supine Long Arc Quad: AROM, Right, 10 reps, Seated   Assessment/Plan    PT Assessment Patient needs continued PT services  PT Problem List Decreased strength;Decreased range of motion;Decreased activity tolerance;Decreased balance;Decreased mobility;Decreased knowledge of use of DME;Pain       PT Treatment Interventions DME instruction;Gait training;Stair training;Therapeutic activities;Therapeutic exercise;Functional mobility training;Balance training;Patient/family education    PT Goals (Current goals can be found in the Care Plan section)  Acute Rehab PT Goals Patient Stated Goal: Regain IND PT Goal Formulation: With patient Time For Goal Achievement: 03/05/22 Potential to Achieve Goals: Good    Frequency 7X/week     Co-evaluation                AM-PAC PT "6 Clicks" Mobility  Outcome Measure Help needed turning from your back to your side while in a flat bed without using bedrails?: None Help needed moving from lying on your back to sitting on the side of a flat bed without using bedrails?: A Little Help needed moving to and from a bed to a chair (including a wheelchair)?: A Little Help needed standing up from a chair using your arms (e.g., wheelchair or bedside chair)?: A Little Help needed to walk in hospital room?: A Little Help needed climbing 3-5 steps with a railing? : A Lot 6 Click Score: 18    End of Session Equipment Utilized During Treatment: Gait belt Activity Tolerance: Patient tolerated treatment well Patient left: in chair;with call bell/phone within reach Nurse Communication: Mobility status PT Visit Diagnosis: Unsteadiness on feet (R26.81);Difficulty in walking, not elsewhere classified (R26.2);Pain Pain - Right/Left: Right Pain - part of body: Knee    Time: 1247-1316 PT Time Calculation (min) (ACUTE ONLY): 29 min   Charges:   PT Evaluation $PT Eval Low Complexity: 1 Low PT Treatments $Therapeutic Exercise: 8-22 mins        Mauro Kaufmann PT Acute Rehabilitation Services Pager [phone] Office [phone]   Khaled Herda 02/26/2022, 1:28 PM

## 2022-02-26 NOTE — Progress Notes (Signed)
Physical Therapy Treatment Patient Details Name: Kari Lopez MRN: [id number] DOB: [birth year] Today's Date: 02/26/2022   History of Present Illness Pt s/p R TKR    PT Comments    Pt's spouse present for family ed and reviewed HEP with written instruction provided.  Pt up to ambulate in hall and negotiated stairs with noted improvement in stability from first attempt.  Pt and spouse eager for dc home this date.  Recommendations for follow up therapy are one component of a multi-disciplinary discharge planning process, led by the attending physician.  Recommendations may be updated based on patient status, additional functional criteria and insurance authorization.  Follow Up Recommendations  Follow physician's recommendations for discharge plan and follow up therapies     Assistance Recommended at Discharge Frequent or constant Supervision/Assistance  Patient can return home with the following A little help with walking and/or transfers;A little help with bathing/dressing/bathroom;Assistance with cooking/housework;Assist for transportation;Help with stairs or ramp for entrance   Equipment Recommendations  None recommended by PT    Recommendations for Other Services       Precautions / Restrictions Precautions Precautions: Fall;Knee Restrictions Weight Bearing Restrictions: No RLE Weight Bearing: Weight bearing as tolerated     Mobility  Bed Mobility Overal bed mobility: Needs Assistance Bed Mobility: Supine to Sit     Supine to sit: Min guard     General bed mobility comments: Pt up in chair and returns to same    Transfers Overall transfer level: Needs assistance Equipment used: Rolling walker (2 wheels) Transfers: Sit to/from Stand Sit to Stand: Min guard, Supervision           General transfer comment: Steady assist with cues for LE management and use of UEs to self assist    Ambulation/Gait Ambulation/Gait assistance: Min guard, Supervision Gait  Distance (Feet): 50 Feet Assistive device: Rolling walker (2 wheels) Gait Pattern/deviations: Step-to pattern, Decreased step length - right, Decreased step length - left, Shuffle, Trunk flexed Gait velocity: decr     General Gait Details: cues for sequence, posture and position from RW   Stairs Stairs: Yes Stairs assistance: Min assist Stair Management: No rails, Step to pattern, Backwards, With walker, Two rails, Forwards Number of Stairs: 4 General stair comments: 2 steps bkwd with RW and 2 steps fwd with bil rails. Cues for sequence and foot/RW placement,   Wheelchair Mobility    Modified Rankin (Stroke Patients Only)       Balance Overall balance assessment: Needs assistance Sitting-balance support: No upper extremity supported, Feet supported Sitting balance-Leahy Scale: Good     Standing balance support: Bilateral upper extremity supported Standing balance-Leahy Scale: Poor                              Cognition Arousal/Alertness: Awake/alert Behavior During Therapy: WFL for tasks assessed/performed Overall Cognitive Status: Within Functional Limits for tasks assessed                                          Exercises Total Joint Exercises Ankle Circles/Pumps: AROM, Both, 20 reps, Supine Quad Sets: AROM, Both, Supine, 5 reps Heel Slides: AAROM, Right, Supine, 5 reps Hip ABduction/ADduction: AROM, Right, Supine, 5 reps Straight Leg Raises: AAROM, AROM, Right, Supine, 5 reps Long Arc Quad: AROM, Right, Seated, 5 reps    General Comments  Pertinent Vitals/Pain Pain Assessment Pain Assessment: 0-10 Pain Score: 2  Pain Location: R knee Pain Descriptors / Indicators: Aching, Sore Pain Intervention(s): Limited activity within patient's tolerance, Monitored during session, Premedicated before session    Home Living Family/patient expects to be discharged to:: Private residence Living Arrangements: Spouse/significant  other;Children Available Help at Discharge: Family Type of Home: House Home Access: Stairs to enter Entrance Stairs-Rails: None Entrance Stairs-Number of Steps: 3 Alternate Level Stairs-Number of Steps: flight Home Layout: Two level;Able to live on main level with bedroom/bathroom Home Equipment: Rolling Walker (2 wheels)      Prior Function            PT Goals (current goals can now be found in the care plan section) Acute Rehab PT Goals Patient Stated Goal: Regain IND PT Goal Formulation: With patient Time For Goal Achievement: 03/05/22 Potential to Achieve Goals: Good Progress towards PT goals: Progressing toward goals    Frequency    7X/week      PT Plan Current plan remains appropriate    Co-evaluation              AM-PAC PT "6 Clicks" Mobility   Outcome Measure  Help needed turning from your back to your side while in a flat bed without using bedrails?: None Help needed moving from lying on your back to sitting on the side of a flat bed without using bedrails?: A Little Help needed moving to and from a bed to a chair (including a wheelchair)?: A Little Help needed standing up from a chair using your arms (e.g., wheelchair or bedside chair)?: A Little Help needed to walk in hospital room?: A Little Help needed climbing 3-5 steps with a railing? : A Little 6 Click Score: 19    End of Session Equipment Utilized During Treatment: Gait belt Activity Tolerance: Patient tolerated treatment well Patient left: in chair;with call bell/phone within reach;with family/visitor present Nurse Communication: Mobility status PT Visit Diagnosis: Unsteadiness on feet (R26.81);Difficulty in walking, not elsewhere classified (R26.2);Pain Pain - Right/Left: Right Pain - part of body: Knee     Time: 1350-1411 PT Time Calculation (min) (ACUTE ONLY): 21 min  Charges:  $Gait Training: 8-22 mins $Therapeutic Exercise: 8-22 mins                     Mauro Kaufmann  PT Acute Rehabilitation Services Pager [phone] Office [phone]    Keshona Kartes 02/26/2022, 2:17 PM

## 2022-02-26 NOTE — Interval H&P Note (Signed)
History and Physical Interval Note:  02/26/2022 6:46 AM  Kari Lopez  has presented today for surgery, with the diagnosis of Right knee osteoarthritis.  The various methods of treatment have been discussed with the patient and family. After consideration of risks, benefits and other options for treatment, the patient has consented to  Procedure(s): COMPUTER ASSISTED TOTAL KNEE ARTHROPLASTY (Right) as a surgical intervention.  The patient's history has been reviewed, patient examined, no change in status, stable for surgery.  I have reviewed the patient's chart and labs.  Questions were answered to the patient's satisfaction.     Iline Oven Nichele Slawson

## 2022-02-27 ENCOUNTER — Encounter (HOSPITAL_COMMUNITY): Payer: Self-pay | Admitting: Orthopedic Surgery

## 2022-03-03 DIAGNOSIS — M25661 Stiffness of right knee, not elsewhere classified: Secondary | ICD-10-CM | POA: Diagnosis not present

## 2022-03-03 DIAGNOSIS — M25561 Pain in right knee: Secondary | ICD-10-CM | POA: Diagnosis not present

## 2022-03-12 DIAGNOSIS — M25661 Stiffness of right knee, not elsewhere classified: Secondary | ICD-10-CM | POA: Diagnosis not present

## 2022-03-12 DIAGNOSIS — M25561 Pain in right knee: Secondary | ICD-10-CM | POA: Diagnosis not present

## 2022-03-13 DIAGNOSIS — Z96651 Presence of right artificial knee joint: Secondary | ICD-10-CM | POA: Diagnosis not present

## 2022-03-13 DIAGNOSIS — Z471 Aftercare following joint replacement surgery: Secondary | ICD-10-CM | POA: Diagnosis not present

## 2022-03-17 DIAGNOSIS — M25661 Stiffness of right knee, not elsewhere classified: Secondary | ICD-10-CM | POA: Diagnosis not present

## 2022-03-17 DIAGNOSIS — M25561 Pain in right knee: Secondary | ICD-10-CM | POA: Diagnosis not present

## 2022-03-19 DIAGNOSIS — M25561 Pain in right knee: Secondary | ICD-10-CM | POA: Diagnosis not present

## 2022-03-19 DIAGNOSIS — M25661 Stiffness of right knee, not elsewhere classified: Secondary | ICD-10-CM | POA: Diagnosis not present

## 2022-03-26 DIAGNOSIS — M25661 Stiffness of right knee, not elsewhere classified: Secondary | ICD-10-CM | POA: Diagnosis not present

## 2022-03-26 DIAGNOSIS — M25561 Pain in right knee: Secondary | ICD-10-CM | POA: Diagnosis not present

## 2022-03-27 ENCOUNTER — Other Ambulatory Visit: Payer: Self-pay | Admitting: Family Medicine

## 2022-04-13 DIAGNOSIS — Z96651 Presence of right artificial knee joint: Secondary | ICD-10-CM | POA: Diagnosis not present

## 2022-04-13 DIAGNOSIS — Z471 Aftercare following joint replacement surgery: Secondary | ICD-10-CM | POA: Diagnosis not present

## 2022-05-05 ENCOUNTER — Other Ambulatory Visit: Payer: Self-pay | Admitting: Family Medicine

## 2022-05-12 ENCOUNTER — Other Ambulatory Visit (INDEPENDENT_AMBULATORY_CARE_PROVIDER_SITE_OTHER): Payer: BC Managed Care – PPO

## 2022-05-12 ENCOUNTER — Encounter: Payer: Self-pay | Admitting: Family Medicine

## 2022-05-12 ENCOUNTER — Other Ambulatory Visit: Payer: Self-pay | Admitting: Family Medicine

## 2022-05-12 DIAGNOSIS — Z0000 Encounter for general adult medical examination without abnormal findings: Secondary | ICD-10-CM | POA: Diagnosis not present

## 2022-05-12 DIAGNOSIS — Z792 Long term (current) use of antibiotics: Secondary | ICD-10-CM

## 2022-05-12 DIAGNOSIS — E785 Hyperlipidemia, unspecified: Secondary | ICD-10-CM | POA: Diagnosis not present

## 2022-05-12 LAB — COMPREHENSIVE METABOLIC PANEL
ALT: 33 U/L (ref 0–35)
AST: 32 U/L (ref 0–37)
Albumin: 4.5 g/dL (ref 3.5–5.2)
Alkaline Phosphatase: 97 U/L (ref 39–117)
BUN: 15 mg/dL (ref 6–23)
CO2: 29 mEq/L (ref 19–32)
Calcium: 9.5 mg/dL (ref 8.4–10.5)
Chloride: 100 mEq/L (ref 96–112)
Creatinine, Ser: 0.64 mg/dL (ref 0.40–1.20)
GFR: 101.82 mL/min (ref >60.00)
Glucose, Bld: 110 mg/dL — ABNORMAL HIGH (ref 70–99)
Potassium: 4.7 mEq/L (ref 3.5–5.1)
Sodium: 137 mEq/L (ref 135–145)
Total Bilirubin: 0.6 mg/dL (ref 0.2–1.2)
Total Protein: 7.2 g/dL (ref 6.0–8.3)

## 2022-05-12 LAB — LIPID PANEL
Cholesterol: 213 mg/dL — ABNORMAL HIGH (ref 0–200)
HDL: 56.6 mg/dL (ref >39.00)
NonHDL: 156
Total CHOL/HDL Ratio: 4
Triglycerides: 253 mg/dL — ABNORMAL HIGH (ref 0.0–149.0)
VLDL: 50.6 mg/dL — ABNORMAL HIGH (ref 0.0–40.0)

## 2022-05-12 LAB — TSH: TSH: 3.45 u[IU]/mL (ref 0.35–5.50)

## 2022-05-12 LAB — LDL CHOLESTEROL, DIRECT: Direct LDL: 132 mg/dL

## 2022-05-12 MED ORDER — AMOXICILLIN 500 MG PO CAPS: ORAL_CAPSULE | ORAL | 2 refills | Status: DC

## 2022-06-15 DIAGNOSIS — R062 Wheezing: Secondary | ICD-10-CM | POA: Diagnosis not present

## 2022-06-15 DIAGNOSIS — R051 Acute cough: Secondary | ICD-10-CM | POA: Diagnosis not present

## 2022-08-01 ENCOUNTER — Other Ambulatory Visit: Payer: Self-pay | Admitting: Family Medicine

## 2022-08-05 ENCOUNTER — Encounter: Payer: Self-pay | Admitting: Family Medicine

## 2022-08-12 ENCOUNTER — Other Ambulatory Visit: Payer: Self-pay | Admitting: Family Medicine

## 2022-08-12 MED ORDER — ZEPBOUND 2.5 MG/0.5ML ~~LOC~~ SOAJ: 2.5000 mg | SUBCUTANEOUS | 0 refills | Status: DC

## 2022-08-13 ENCOUNTER — Encounter: Payer: Self-pay | Admitting: Family Medicine

## 2022-08-13 ENCOUNTER — Ambulatory Visit (INDEPENDENT_AMBULATORY_CARE_PROVIDER_SITE_OTHER): Payer: BC Managed Care – PPO | Admitting: Family Medicine

## 2022-08-13 VITALS — BP 128/88 | HR 79 | Temp 98.1°F | Resp 18 | Ht 67.0 in | Wt 212.8 lb

## 2022-08-13 DIAGNOSIS — I10 Essential (primary) hypertension: Secondary | ICD-10-CM

## 2022-08-13 DIAGNOSIS — Z1211 Encounter for screening for malignant neoplasm of colon: Secondary | ICD-10-CM | POA: Diagnosis not present

## 2022-08-13 DIAGNOSIS — E785 Hyperlipidemia, unspecified: Secondary | ICD-10-CM

## 2022-08-13 LAB — LIPID PANEL
Cholesterol: 200 mg/dL (ref 0–200)
HDL: 74.9 mg/dL (ref >39.00)
LDL Cholesterol: 86 mg/dL (ref 0–99)
NonHDL: 125.03
Total CHOL/HDL Ratio: 3
Triglycerides: 195 mg/dL — ABNORMAL HIGH (ref 0.0–149.0)
VLDL: 39 mg/dL (ref 0.0–40.0)

## 2022-08-13 LAB — CBC WITH DIFFERENTIAL/PLATELET
Basophils Absolute: 0.1 10*3/uL (ref 0.0–0.1)
Basophils Relative: 1 % (ref 0.0–3.0)
Eosinophils Absolute: 0.2 10*3/uL (ref 0.0–0.7)
Eosinophils Relative: 2.7 % (ref 0.0–5.0)
HCT: 37 % (ref 36.0–46.0)
Hemoglobin: 12.1 g/dL (ref 12.0–15.0)
Lymphocytes Relative: 32.8 % (ref 12.0–46.0)
Lymphs Abs: 2 10*3/uL (ref 0.7–4.0)
MCHC: 32.6 g/dL (ref 30.0–36.0)
MCV: 82 fl (ref 78.0–100.0)
Monocytes Absolute: 0.7 10*3/uL (ref 0.1–1.0)
Monocytes Relative: 11 % (ref 3.0–12.0)
Neutro Abs: 3.2 10*3/uL (ref 1.4–7.7)
Neutrophils Relative %: 52.5 % (ref 43.0–77.0)
Platelets: 387 10*3/uL (ref 150.0–400.0)
RBC: 4.52 Mil/uL (ref 3.87–5.11)
RDW: 15.1 % (ref 11.5–15.5)
WBC: 6.1 10*3/uL (ref 4.0–10.5)

## 2022-08-13 LAB — COMPREHENSIVE METABOLIC PANEL
ALT: 40 U/L — ABNORMAL HIGH (ref 0–35)
AST: 40 U/L — ABNORMAL HIGH (ref 0–37)
Albumin: 4.9 g/dL (ref 3.5–5.2)
Alkaline Phosphatase: 81 U/L (ref 39–117)
BUN: 15 mg/dL (ref 6–23)
CO2: 30 mEq/L (ref 19–32)
Calcium: 10.1 mg/dL (ref 8.4–10.5)
Chloride: 98 mEq/L (ref 96–112)
Creatinine, Ser: 0.77 mg/dL (ref 0.40–1.20)
GFR: 88.72 mL/min (ref >60.00)
Glucose, Bld: 106 mg/dL — ABNORMAL HIGH (ref 70–99)
Potassium: 4.6 mEq/L (ref 3.5–5.1)
Sodium: 139 mEq/L (ref 135–145)
Total Bilirubin: 0.6 mg/dL (ref 0.2–1.2)
Total Protein: 7.7 g/dL (ref 6.0–8.3)

## 2022-08-13 LAB — TSH: TSH: 4.11 u[IU]/mL (ref 0.35–5.50)

## 2022-08-13 NOTE — Assessment & Plan Note (Signed)
Tolerating statin, encouraged heart healthy diet, avoid trans fats, minimize simple carbs and saturated fats. Increase exercise as tolerated 

## 2022-08-13 NOTE — Progress Notes (Signed)
Subjective:   By signing my name below, I, Shehryar Baig, attest that this documentation has been prepared under the direction and in the presence of Ann Held, DO. 08/13/2022   Patient ID: Kari Lopez, female    DOB: 16-Dec-1970, 52 y.o.   MRN: [id number]  Chief Complaint  Patient presents with   Hypertension   Hyperlipidemia   Follow-up    Hypertension  Hyperlipidemia   Patient is in today for a follow up visit. She is present with her sister during this visit.   She complains of left hip stiffness since her last surgery. It worsens while walking normally. She completed her physical therapy following her last physical therapy and reports gaining back most function in her leg.  Her blood pressure is stable during this visit while taking 25 mg hydrochlorothiazide, 100 mg losartan. She has mild ankle swelling after sitting down for long periods.  BP Readings from Last 3 Encounters:  08/13/22 128/88  02/26/22 129/84  02/17/22 131/82   Pulse Readings from Last 3 Encounters:  08/13/22 79  02/26/22 74  02/17/22 90   She continues taking 20 mg Crestor daily PO to manage her cholesterol levels.  Her last cholesterol levels showed improvement.  Lab Results  Component Value Date   CHOL 213 (H) 05/12/2022   HDL 56.60 05/12/2022   LDLCALC 181 (H) 12/26/2020   LDLDIRECT 132.0 05/12/2022   TRIG 253.0 (H) 05/12/2022   CHOLHDL 4 05/12/2022   She needs prior authorization before the can start taking zepbound.  She is due for a colonoscopy.  She needs to schedule an appointment to take out her IUD.    Past Medical History:  Diagnosis Date   Arthritis    Depression    GERD (gastroesophageal reflux disease)    Hypertension     Past Surgical History:  Procedure Laterality Date   CARPAL TUNNEL RELEASE     IRRIGATION AND DEBRIDEMENT ELBOW Left 06/16/2021   Procedure: IRRIGATION AND DEBRIDEMENT LEFT OLECRANON BURSA;  Surgeon: Sherilyn Cooter, MD;  Location: [place] [facility];  Service: Orthopedics;  Laterality: Left;   KNEE ARTHROPLASTY Right 02/26/2022   Procedure: COMPUTER ASSISTED TOTAL KNEE ARTHROPLASTY;  Surgeon: Rod Can, MD;  Location: [facility];  Service: Orthopedics;  Laterality: Right;   KNEE ARTHROSCOPY WITH ANTERIOR CRUCIATE LIGAMENT (ACL) REPAIR Right 02/18/2004   ULNAR TUNNEL RELEASE Left 06/16/2021   Procedure: LEFT CUBITAL TUNNEL RELEASE;  Surgeon: Sherilyn Cooter, MD;  Location: [facility];  Service: Orthopedics;  Laterality: Left;    Family History  Problem Relation Age of Onset   Coronary artery disease Other    Diabetes Other    Hyperlipidemia Other    Hypertension Other    Cancer Other        LUNG    Social History   Socioeconomic History   Marital status: Married    Spouse name: Elta Guadel[place]   Number of children: Not on file   Years of education: Not on file   Highest education level: Not on file  Occupational History    Employer: [employer]  Tobacco Use   Smoking status: Some Days    Types: Cigarettes   Smokeless tobacco: Never  Vaping Use   Vaping Use: Never used  Substance and Sexual Activity   Alcohol use: Yes    Comment: 48 oz beer daily   Drug use: No   Sexual activity: Yes    Partners: Male  Other Topics Concern  Not on file  Social History Narrative   Exercise--- gym ,  3x a week   Social Determinants of Health   Financial Resource Strain: Not on file  Food Insecurity: Not on file  Transportation Needs: Not on file  Physical Activity: Not on file  Stress: Not on file  Social Connections: Not on file  Intimate Partner Violence: Not on file    Outpatient Medications Prior to Visit  Medication Sig Dispense Refill   amoxicillin (AMOXIL) 500 MG capsule 4 po x1  1 hour prior to cleaning 4 capsule 2   celecoxib (CELEBREX) 200 MG capsule Take 1 capsule (200 mg total) by mouth daily. 90 capsule 3   escitalopram (LEXAPRO) 10 MG tablet TAKE 1 TABLET(10 MG) BY MOUTH DAILY 90  tablet 3   hydrochlorothiazide (HYDRODIURIL) 25 MG tablet Take 1 tablet (25 mg total) by mouth daily. 90 tablet 3   levonorgestrel (MIRENA) 20 MCG/24HR IUD 1 Intra Uterine Device (1 each total) by Intrauterine route once. 1 each 0   losartan (COZAAR) 100 MG tablet Take 1 tablet (100 mg total) by mouth daily. 90 tablet 1   omeprazole (PRILOSEC) 20 MG capsule TAKE 1 CAPSULE(20 MG) BY MOUTH DAILY 90 capsule 3   ondansetron (ZOFRAN) 4 MG tablet Take 1 tablet (4 mg total) by mouth every 8 (eight) hours as needed for nausea or vomiting. 20 tablet 0   polyethylene glycol (MIRALAX) 17 g packet Take 17 g by mouth daily as needed for moderate constipation or severe constipation. 14 each 0   Polyethylene Glycol 400 0.25 % GEL Place 1 Application into both eyes daily as needed (dry eyes).     rosuvastatin (CRESTOR) 20 MG tablet TAKE 1 TABLET(20 MG) BY MOUTH DAILY 90 tablet 1   tirzepatide (ZEPBOUND) 2.5 MG/0.5ML Pen Inject 2.5 mg into the skin once a week. 0.5 mL 0   No facility-administered medications prior to visit.    Allergies  Allergen Reactions   Codeine     Unknown reaction    ROS     Objective:    Physical Exam Constitutional:      General: She is not in acute distress.    Appearance: Normal appearance. She is not ill-appearing.  HENT:     Head: Normocephalic and atraumatic.     Right Ear: External ear normal.     Left Ear: External ear normal.  Eyes:     Extraocular Movements: Extraocular movements intact.     Pupils: Pupils are equal, round, and reactive to light.  Cardiovascular:     Rate and Rhythm: Normal rate and regular rhythm.     Heart sounds: Normal heart sounds. No murmur heard.    No gallop.  Pulmonary:     Effort: Pulmonary effort is normal. No respiratory distress.     Breath sounds: Normal breath sounds. No wheezing or rales.  Skin:    General: Skin is warm and dry.  Neurological:     Mental Status: She is alert and oriented to person, place, and time.   Psychiatric:        Judgment: Judgment normal.     BP 128/88 (BP Location: Right Arm, Patient Position: Sitting, Cuff Size: Normal)   Pulse 79   Temp 98.1 F (36.7 C) (Oral)   Resp 18   Ht 5' 7"$  (1.702 m)   Wt 212 lb 12.8 oz (96.5 kg)   SpO2 98%   BMI 33.33 kg/m  Wt Readings from Last 3 Encounters:  08/13/22  212 lb 12.8 oz (96.5 kg)  02/26/22 208 lb (94.3 kg)  02/17/22 208 lb (94.3 kg)       Assessment & Plan:  Hyperlipidemia, unspecified hyperlipidemia type Assessment & Plan: Tolerating statin, encouraged heart healthy diet, avoid trans fats, minimize simple carbs and saturated fats. Increase exercise as tolerated   Orders: -     CBC with Differential/Platelet -     Comprehensive metabolic panel -     Lipid panel -     TSH  Primary hypertension Assessment & Plan: Well controlled, no changes to meds. Encouraged heart healthy diet such as the DASH diet and exercise as tolerated.    Orders: -     CBC with Differential/Platelet -     Comprehensive metabolic panel -     Lipid panel -     TSH  Colon cancer screening -     Ambulatory referral to Gastroenterology    I, Ann Held, DO, personally preformed the services described in this documentation.  All medical record entries made by the scribe were at my direction and in my presence.  I have reviewed the chart and discharge instructions (if applicable) and agree that the record reflects my personal performance and is accurate and complete. 08/13/2022   I,Shehryar Baig,acting as a scribe for Ann Held, DO.,have documented all relevant documentation on the behalf of Ann Held, DO,as directed by  Ann Held, DO while in the presence of Ann Held, DO.   Ann Held, DO

## 2022-08-13 NOTE — Assessment & Plan Note (Signed)
Well controlled, no changes to meds. Encouraged heart healthy diet such as the DASH diet and exercise as tolerated.  

## 2022-08-20 ENCOUNTER — Other Ambulatory Visit: Payer: Self-pay | Admitting: Family Medicine

## 2022-08-20 DIAGNOSIS — I10 Essential (primary) hypertension: Secondary | ICD-10-CM

## 2022-08-24 ENCOUNTER — Encounter: Payer: Self-pay | Admitting: Family Medicine

## 2022-10-15 ENCOUNTER — Encounter: Payer: Self-pay | Admitting: Family Medicine

## 2022-10-30 ENCOUNTER — Ambulatory Visit (INDEPENDENT_AMBULATORY_CARE_PROVIDER_SITE_OTHER): Payer: BC Managed Care – PPO | Admitting: Family Medicine

## 2022-10-30 ENCOUNTER — Encounter: Payer: Self-pay | Admitting: Family Medicine

## 2022-10-30 VITALS — BP 122/80 | HR 89 | Temp 99.3°F | Resp 18 | Ht 67.0 in | Wt 215.4 lb

## 2022-10-30 DIAGNOSIS — E785 Hyperlipidemia, unspecified: Secondary | ICD-10-CM | POA: Diagnosis not present

## 2022-10-30 DIAGNOSIS — R196 Halitosis: Secondary | ICD-10-CM | POA: Insufficient documentation

## 2022-10-30 LAB — CBC WITH DIFFERENTIAL/PLATELET
Basophils Absolute: 0 10*3/uL (ref 0.0–0.1)
Basophils Relative: 0.5 % (ref 0.0–3.0)
Eosinophils Absolute: 0.1 10*3/uL (ref 0.0–0.7)
Eosinophils Relative: 2 % (ref 0.0–5.0)
HCT: 39.3 % (ref 36.0–46.0)
Hemoglobin: 13 g/dL (ref 12.0–15.0)
Lymphocytes Relative: 30.8 % (ref 12.0–46.0)
Lymphs Abs: 2.1 10*3/uL (ref 0.7–4.0)
MCHC: 33 g/dL (ref 30.0–36.0)
MCV: 79.8 fl (ref 78.0–100.0)
Monocytes Absolute: 0.7 10*3/uL (ref 0.1–1.0)
Monocytes Relative: 10.6 % (ref 3.0–12.0)
Neutro Abs: 3.9 10*3/uL (ref 1.4–7.7)
Neutrophils Relative %: 56.1 % (ref 43.0–77.0)
Platelets: 347 10*3/uL (ref 150.0–400.0)
RBC: 4.93 Mil/uL (ref 3.87–5.11)
RDW: 16.5 % — ABNORMAL HIGH (ref 11.5–15.5)
WBC: 6.9 10*3/uL (ref 4.0–10.5)

## 2022-10-30 LAB — COMPREHENSIVE METABOLIC PANEL
ALT: 32 U/L (ref 0–35)
AST: 31 U/L (ref 0–37)
Albumin: 4.7 g/dL (ref 3.5–5.2)
Alkaline Phosphatase: 71 U/L (ref 39–117)
BUN: 18 mg/dL (ref 6–23)
CO2: 26 mEq/L (ref 19–32)
Calcium: 9.5 mg/dL (ref 8.4–10.5)
Chloride: 99 mEq/L (ref 96–112)
Creatinine, Ser: 0.88 mg/dL (ref 0.40–1.20)
GFR: 75.47 mL/min (ref >60.00)
Glucose, Bld: 96 mg/dL (ref 70–99)
Potassium: 4.3 mEq/L (ref 3.5–5.1)
Sodium: 138 mEq/L (ref 135–145)
Total Bilirubin: 0.5 mg/dL (ref 0.2–1.2)
Total Protein: 7.6 g/dL (ref 6.0–8.3)

## 2022-10-30 LAB — LIPID PANEL
Cholesterol: 165 mg/dL (ref 0–200)
HDL: 66.9 mg/dL (ref >39.00)
LDL Cholesterol: 67 mg/dL (ref 0–99)
NonHDL: 98.36
Total CHOL/HDL Ratio: 2
Triglycerides: 158 mg/dL — ABNORMAL HIGH (ref 0.0–149.0)
VLDL: 31.6 mg/dL (ref 0.0–40.0)

## 2022-10-30 LAB — HEMOGLOBIN A1C: Hgb A1c MFr Bld: 6.2 % (ref 4.6–6.5)

## 2022-10-30 NOTE — Progress Notes (Signed)
Established Patient Office Visit  Subjective   Patient ID: Kari Lopez, female    DOB: 1971-04-11  Age: 52 y.o. MRN: [id number]  Chief Complaint  Patient presents with   Other    Pt here to be tested for diabetes after a situation at work where people thought she had been drinking. Pt also requesting a refill on a muscle relaxer.     HPI Pt is here because of being fired from work because she was accused of having alcohol on her breath while at work.  She had not had anything to drink. 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  None of the people she works with smelled alcohol on her breath.  Patient Active Problem List   Diagnosis Date Noted   Bad breath 10/30/2022   Osteoarthritis of right knee 02/26/2022   Pre-operative clearance 01/27/2022   Cubital tunnel syndrome on left 05/30/2021   Hip pain, acute, left 02/25/2021   Elbow pain, left 02/25/2021   Preventative health care 12/26/2020   Chronic pain of both knees 10/05/2019   Depression, major, single episode, mild (HCC) 10/05/2019   HTN (hypertension) 02/13/2019   Depression, major, single episode, moderate (HCC) 05/16/2017   GERD (gastroesophageal reflux disease) 05/16/2017   Hyperlipidemia 02/06/2013   DEPRESSION, RECURRENT 04/16/2008   Past Medical History:  Diagnosis Date   Arthritis    Depression    GERD (gastroesophageal reflux disease)    Hypertension    Past Surgical History:  Procedure Laterality Date   CARPAL TUNNEL RELEASE     IRRIGATION AND DEBRIDEMENT ELBOW Left 06/16/2021   Procedure: IRRIGATION AND DEBRIDEMENT LEFT OLECRANON BURSA;  Surgeon: Marlyne Beards, MD;  Location: [facility];  Service: Orthopedics;  Laterality: Left;   KNEE ARTHROPLASTY Right 02/26/2022   Procedure: COMPUTER ASSISTED  TOTAL KNEE ARTHROPLASTY;  Surgeon: Samson Frederic, MD;  Location: [facility];  Service: Orthopedics;  Laterality: Right;   KNEE ARTHROSCOPY WITH ANTERIOR CRUCIATE LIGAMENT (ACL) REPAIR Right 02/18/2004   ULNAR TUNNEL RELEASE Left 06/16/2021   Procedure: LEFT CUBITAL TUNNEL RELEASE;  Surgeon: Marlyne Beards, MD;  Location: [facility];  Service: Orthopedics;  Laterality: Left;   Social History   Tobacco Use   Smoking status: Some Days    Types: Cigarettes   Smokeless tobacco: Never  Vaping Use   Vaping Use: Never used  Substance Use Topics   Alcohol use: Yes    Comment: 48 oz beer daily   Drug use: No   Social History   Socioeconomic History   Marital status: Married    Spouse name: Merchant navy officer   Number of children: Not on file   Years of education: Not on file   Highest education level: Not on file  Occupational History    Employer: [employer]  Tobacco Use   Smoking status: Some Days    Types: Cigarettes   Smokeless tobacco: Never  Vaping Use   Vaping Use: Never used  Substance and Sexual Activity   Alcohol use: Yes    Comment: 48 oz beer daily   Drug use: No   Sexual activity: Yes    Partners: Male  Other Topics Concern   Not on file  Social History Narrative   Exercise--- gym ,  3x a week  Social Determinants of Health   Financial Resource Strain: Not on file  Food Insecurity: Not on file  Transportation Needs: Not on file  Physical Activity: Not on file  Stress: Not on file  Social Connections: Not on file  Intimate Partner Violence: Not on file   Family Status  Relation Name Status   Other FAM HX OF Alive   Mother  Deceased at age 87       suicide   Father  Alive   Family History  Problem Relation Age of Onset   Coronary artery disease Other    Diabetes Other    Hyperlipidemia Other    Hypertension Other    Cancer Other        LUNG   Allergies  Allergen Reactions   Codeine     Unknown reaction      Review of Systems   Constitutional:  Negative for fever and malaise/fatigue.  HENT:  Negative for congestion.   Eyes:  Negative for blurred vision.  Respiratory:  Negative for cough and shortness of breath.   Cardiovascular:  Negative for chest pain, palpitations and leg swelling.  Gastrointestinal:  Negative for abdominal pain, blood in stool, nausea and vomiting.  Genitourinary:  Negative for dysuria and frequency.  Musculoskeletal:  Negative for back pain and falls.  Skin:  Negative for rash.  Neurological:  Negative for dizziness, loss of consciousness and headaches.  Endo/Heme/Allergies:  Negative for environmental allergies.  Psychiatric/Behavioral:  Negative for depression. The patient is not nervous/anxious.       Objective:     BP 122/80 (BP Location: Right Arm, Patient Position: Sitting, Cuff Size: Normal)   Pulse 89   Temp 99.3 F (37.4 C) (Oral)   Resp 18   Ht 5\' 7"  (1.702 m)   Wt 215 lb 6.4 oz (97.7 kg)   SpO2 97%   BMI 33.74 kg/m  BP Readings from Last 3 Encounters:  10/30/22 122/80  08/13/22 128/88  02/26/22 129/84   Wt Readings from Last 3 Encounters:  10/30/22 215 lb 6.4 oz (97.7 kg)  08/13/22 212 lb 12.8 oz (96.5 kg)  02/26/22 208 lb (94.3 kg)   SpO2 Readings from Last 3 Encounters:  10/30/22 97%  08/13/22 98%  02/26/22 96%      Physical Exam Vitals and nursing note reviewed.  Constitutional:      Appearance: She is well-developed.  HENT:     Head: Normocephalic and atraumatic.  Eyes:     Conjunctiva/sclera: Conjunctivae normal.  Neck:     Thyroid: No thyromegaly.     Vascular: No carotid bruit or JVD.  Cardiovascular:     Rate and Rhythm: Normal rate and regular rhythm.     Heart sounds: Normal heart sounds. No murmur heard. Pulmonary:     Effort: Pulmonary effort is normal. No respiratory distress.     Breath sounds: Normal breath sounds. No wheezing or rales.  Chest:     Chest wall: No tenderness.  Musculoskeletal:     Cervical back: Normal range of  motion and neck supple.  Neurological:     Mental Status: She is alert and oriented to person, place, and time.      No results found for any visits on 10/30/22.  Last CBC Lab Results  Component Value Date   WBC 6.1 08/13/2022   HGB 12.1 08/13/2022   HCT 37.0 08/13/2022   MCV 82.0 08/13/2022   MCH 30.9 02/17/2022   RDW 15.1 08/13/2022   PLT 387.0  08/13/2022   Last metabolic panel Lab Results  Component Value Date   GLUCOSE 106 (H) 08/13/2022   NA 139 08/13/2022   K 4.6 08/13/2022   CL 98 08/13/2022   CO2 30 08/13/2022   BUN 15 08/13/2022   CREATININE 0.77 08/13/2022   GFRNONAA >60 02/17/2022   CALCIUM 10.1 08/13/2022   PROT 7.7 08/13/2022   ALBUMIN 4.9 08/13/2022   BILITOT 0.6 08/13/2022   ALKPHOS 81 08/13/2022   AST 40 (H) 08/13/2022   ALT 40 (H) 08/13/2022   ANIONGAP 9 02/17/2022   Last lipids Lab Results  Component Value Date   CHOL 200 08/13/2022   HDL 74.90 08/13/2022   LDLCALC 86 08/13/2022   LDLDIRECT 132.0 05/12/2022   TRIG 195.0 (H) 08/13/2022   CHOLHDL 3 08/13/2022   Last hemoglobin A1c No results found for: "HGBA1C" Last thyroid functions Lab Results  Component Value Date   TSH 4.11 08/13/2022   Last vitamin D No results found for: "25OHVITD2", "25OHVITD3", "VD25OH" Last vitamin B12 and Folate No results found for: "VITAMINB12", "FOLATE"    The 10-year ASCVD risk score (Arnett DK, et al., 2019) is: 3.4%    Assessment & Plan:   Problem List Items Addressed This Visit       Unprioritized   Hyperlipidemia    Encourage heart healthy diet such as MIND or DASH diet, increase exercise, avoid trans fats, simple carbohydrates and processed foods, consider a krill or fish or flaxseed oil cap daily.        Relevant Orders   Lipid panel   Bad breath - Primary    Not today Check BAL even though she states this occurred earlier this week And r/o diabetes        Relevant Orders   Comprehensive metabolic panel   Hemoglobin A1c    Alcohol, methyl (methanol), blood   CBC with Differential/Platelet    No follow-ups on file.    Donato Schultz, DO

## 2022-10-30 NOTE — Patient Instructions (Signed)
Hyperglycemia Hyperglycemia occurs when the level of sugar (glucose) in the blood is too high. Glucose is a type of sugar that provides the body's main source of energy. Certain hormones (insulin and glucagon) control the level of glucose in the blood. Insulin lowers blood glucose, and glucagon increases blood glucose. Hyperglycemia can result from not having enough insulin in the bloodstream, or from the body not responding normally to insulin. Hyperglycemia occurs most often in people who have diabetes (diabetes mellitus), but it can happen in people who do not have diabetes. It can develop quickly, and it can be life-threatening if it causes you to become severely dehydrated (diabetic ketoacidosis or hyperglycemic hyperosmolar state). Severe hyperglycemia is a medical emergency. For most people with diabetes, a blood glucose level above 240 mg/dL is considered hyperglycemia. What are the causes? If you have diabetes, hyperglycemia may be caused by: Medicines that increase blood glucose or affect your diabetes control. Getting less physical activity. Eating more than planned. Being sick or injured, having an infection, or having surgery. Stress. Not giving yourself enough insulin (if you are taking insulin). If you have undiagnosed diabetes, this may be the reason you have hyperglycemia. If you do not have diabetes, hyperglycemia may be caused by: Certain medicines, including: Steroid medicines. Beta-blockers. Epinephrine. Thiazide diuretics. Stress. Having a serious illness, an infection, or surgery. Diseases of the pancreas. What increases the risk? Hyperglycemia is more likely to develop in people who have risk factors for diabetes, such as: Having a family member with diabetes. Certain conditions in which the body's disease-fighting system (immune system) attacks itself (autoimmune disorders). Being overweight or obese. Having an inactive (sedentary) lifestyle. Having been diagnosed  with insulin resistance. Having a history of prediabetes, gestational diabetes, or polycystic ovarian syndrome (PCOS). What are the signs or symptoms? Hyperglycemia may not cause any symptoms. If you do have symptoms, they may include: Increased thirst. Needing to urinate more often than usual. Hunger. Feeling very tired. Blurry vision. Other symptoms may develop if hyperglycemia gets worse, such as: Dry mouth. Abdominal pain. Loss of appetite. Fruity-smelling breath. Weakness. Unexpected weight loss. Tingling or numbness in the hands or feet. Headache. Cuts or bruises that are slow to heal. How is this diagnosed? Hyperglycemia is diagnosed with a blood test to measure your blood glucose level. This blood test is usually done while you are having symptoms. Your health care provider may also do a physical exam and review your medical history. You may have more tests to determine the cause of your hyperglycemia, such as: A fasting blood glucose (FBG) test. You will not be allowed to eat (you will fast) for at least 8 hours before a blood sample is taken. An A1C blood test. This provides information about blood glucose control over the previous 2-3 months. An oral glucose tolerance test (OGTT). This measures your blood glucose at two times: After fasting. This is your baseline blood glucose level. 2 hours after drinking a beverage that contains glucose. How is this treated? Treatment depends on the cause of your hyperglycemia. Treatment may include: Taking medicine to regulate your blood glucose levels. If you take insulin or other diabetes medicines, your medicine or dosage may be adjusted. Lifestyle changes, such as exercising more, eating healthier foods, or losing weight. Treating an illness or infection. Checking your blood glucose more often. Stopping or reducing steroid medicines. If your hyperglycemia becomes severe and it results in diabetic ketoacidosis or hyperglycemic  hyperosmolar state, you must be hospitalized and given IV fluids   and IV insulin. Follow these instructions at home: General instructions Take over-the-counter and prescription medicines only as told by your health care provider. Do not use any products that contain nicotine or tobacco. These products include cigarettes, chewing tobacco, and vaping devices, such as e-cigarettes. If you need help quitting, ask your health care provider. If you drink alcohol: Limit how much you have to: 0-1 drink a day for women who are not pregnant. 0-2 drinks a day for men. Know how much alcohol is in a drink. In the [place] one drink equals one 12 oz bottle of beer (355 mL), one 5 oz glass of wine (148 mL), or one 1 oz glass of hard liquor (44 mL). Learn to manage stress. If you need help with this, ask your health care provider. Do exercises as told by your health care provider. Keep all follow-up visits. This is important. Eating and drinking  Maintain a healthy weight. Stay hydrated, especially when you exercise, get sick, or spend time in hot temperatures. Drink enough fluid to keep your urine pale yellow. If you have diabetes:  Know the symptoms of hyperglycemia. Follow your diabetes management plan as told by your health care provider. Make sure you: Take your insulin and medicines as told. Follow your exercise plan. Follow your meal plan. Eat on time, and do not skip meals. Check your blood glucose as often as told. Make sure to check your blood glucose before and after exercise. If you exercise longer or in a different way, check your blood glucose more often. Follow your sick day plan whenever you cannot eat or drink normally. Make this plan in advance with your health care provider. Share your diabetes management plan with people in your workplace, school, and household. Check your urine for ketones when you are ill and as told by your health care provider. Carry a medical alert card or wear  medical alert jewelry. Where to find more information American Diabetes Association: www.diabetes.org Contact a health care provider if: Your blood glucose is at or above 240 mg/dL (13.3 mmol/L) for 2 days in a row. You have problems keeping your blood glucose in your target range. You have frequent episodes of hyperglycemia. You have signs of illness, such as nausea, vomiting, or fever. Get help right away if: Your blood glucose monitor reads "high" even when you are taking insulin. You have trouble breathing. You have a change in how you think, feel, or act (mental status). You have nausea or vomiting that does not go away. These symptoms may represent a serious problem that is an emergency. Do not wait to see if the symptoms will go away. Get medical help right away. Call your local emergency services (911 in the [place] Do not drive yourself to the hospital. Summary Hyperglycemia occurs when the level of sugar (glucose) in the blood is too high. Hyperglycemia can happen with or without diabetes, and severe hyperglycemia can be life-threatening. Hyperglycemia is diagnosed with a blood test to measure your blood glucose level. This blood test is usually done while you are having symptoms. Your health care provider may also do a physical exam and review your medical history. If you have diabetes, follow your diabetes management plan as told by your health care provider. Contact your health care provider if you have problems keeping your blood glucose in your target range. This information is not intended to replace advice given to you by your health care provider. Make sure you discuss any questions you have   with your health care provider. Document Revised: 03/29/2020 Document Reviewed: 03/29/2020 Elsevier Patient Education  2023 Elsevier Inc.  

## 2022-10-30 NOTE — Assessment & Plan Note (Signed)
Not today Check BAL even though she states this occurred earlier this week And r/o diabetes

## 2022-10-30 NOTE — Assessment & Plan Note (Signed)
Encourage heart healthy diet such as MIND or DASH diet, increase exercise, avoid trans fats, simple carbohydrates and processed foods, consider a krill or fish or flaxseed oil cap daily.  °

## 2022-12-30 ENCOUNTER — Encounter: Payer: Self-pay | Admitting: Family Medicine

## 2022-12-30 ENCOUNTER — Ambulatory Visit (INDEPENDENT_AMBULATORY_CARE_PROVIDER_SITE_OTHER): Payer: Self-pay | Admitting: Family Medicine

## 2022-12-30 VITALS — BP 129/92 | HR 83 | Wt 224.0 lb

## 2022-12-30 DIAGNOSIS — Z30432 Encounter for removal of intrauterine contraceptive device: Secondary | ICD-10-CM

## 2022-12-30 DIAGNOSIS — T8332XA Displacement of intrauterine contraceptive device, initial encounter: Secondary | ICD-10-CM

## 2022-12-30 NOTE — Progress Notes (Signed)
IUD Removal attempted Patient was in the dorsal lithotomy position, normal external genitalia was noted.  A speculum was placed in the patient's vagina, normal discharge was noted, no lesions. The multiparous cervix was visualized, no lesions, no abnormal discharge,  and was swabbed with Betadine using scopettes.  No IUD strings seen. The cervix was injected with lidocaine 2% without epi. The anterior lip was grasped with tenaculum. A cyto brush was introduced into the cervix and spun around and withdrawn without visualization of the strings. An IUD hook was then introduced past the cervix in attempt to catch the IUD or strings. Again, no strings were seen. This was repeated 4-5 more times without success. The procedure was stopped.   As the patient had pelvis xray within last couple of years, will schedule patient for hysteroscopy.

## 2023-01-20 ENCOUNTER — Other Ambulatory Visit: Payer: Self-pay | Admitting: Family Medicine

## 2023-01-20 DIAGNOSIS — I10 Essential (primary) hypertension: Secondary | ICD-10-CM

## 2023-01-20 DIAGNOSIS — K219 Gastro-esophageal reflux disease without esophagitis: Secondary | ICD-10-CM

## 2023-01-31 ENCOUNTER — Encounter: Payer: Self-pay | Admitting: Family Medicine

## 2023-02-01 ENCOUNTER — Other Ambulatory Visit: Payer: Self-pay | Admitting: *Deleted

## 2023-02-01 DIAGNOSIS — F320 Major depressive disorder, single episode, mild: Secondary | ICD-10-CM

## 2023-02-01 DIAGNOSIS — R232 Flushing: Secondary | ICD-10-CM

## 2023-02-01 MED ORDER — ESCITALOPRAM OXALATE 10 MG PO TABS: 10.0000 mg | ORAL_TABLET | Freq: Every day | ORAL | 0 refills | Status: DC

## 2023-02-05 ENCOUNTER — Other Ambulatory Visit: Payer: Self-pay | Admitting: Family Medicine

## 2023-02-05 DIAGNOSIS — M25552 Pain in left hip: Secondary | ICD-10-CM

## 2023-02-05 MED ORDER — METHOCARBAMOL 500 MG PO TABS: 500.0000 mg | ORAL_TABLET | Freq: Four times a day (QID) | ORAL | 1 refills | Status: DC

## 2023-02-12 ENCOUNTER — Ambulatory Visit (INDEPENDENT_AMBULATORY_CARE_PROVIDER_SITE_OTHER): Payer: 59 | Admitting: Obstetrics and Gynecology

## 2023-02-12 ENCOUNTER — Ambulatory Visit (HOSPITAL_BASED_OUTPATIENT_CLINIC_OR_DEPARTMENT_OTHER)
Admission: RE | Admit: 2023-02-12 | Discharge: 2023-02-12 | Disposition: A | Discharge status: Home / Self Care | Payer: 59 | Source: Ambulatory Visit | Attending: Obstetrics and Gynecology | Admitting: Obstetrics and Gynecology

## 2023-02-12 ENCOUNTER — Encounter: Payer: Self-pay | Admitting: Obstetrics and Gynecology

## 2023-02-12 VITALS — BP 124/83 | HR 78 | Wt 226.0 lb

## 2023-02-12 DIAGNOSIS — Z30432 Encounter for removal of intrauterine contraceptive device: Secondary | ICD-10-CM

## 2023-02-12 DIAGNOSIS — N840 Polyp of corpus uteri: Secondary | ICD-10-CM

## 2023-02-12 DIAGNOSIS — T8332XA Displacement of intrauterine contraceptive device, initial encounter: Secondary | ICD-10-CM | POA: Diagnosis present

## 2023-02-12 DIAGNOSIS — T8332XD Displacement of intrauterine contraceptive device, subsequent encounter: Secondary | ICD-10-CM

## 2023-02-12 MED ORDER — KETOROLAC TROMETHAMINE 30 MG/ML IJ SOLN
30.0000 mg | Freq: Once | INTRAMUSCULAR | Status: AC
Start: 2023-02-12 — End: 2023-02-12
  Administered 2023-02-12: 30 mg via INTRAVENOUS

## 2023-02-12 MED ORDER — LEVONORGESTREL 20 MCG/DAY IU IUD
1.0000 | INTRAUTERINE_SYSTEM | Freq: Once | INTRAUTERINE | Status: DC
Start: 2023-02-12 — End: 2023-02-12

## 2023-02-12 NOTE — Progress Notes (Signed)
OFFICE HYSTEROSCOPY PROCEDURE NOTE  FINDINGS: Normal appearing cervix, large polyp occupying most of the endometrial cavity   ANESTHESIA:  paracervical block. ESTIMATED BLOOD LOSS:  Less than 20 ml SPECIMENS: none COMPLICATIONS:  None immediate.  PROCEDURE DETAILS:  The patient was taken to the procedure room where she received Toradol 10 mg IM.   After an adequate timeout was performed, she was placed in the dorsal lithotomy position.   A speculum was then placed in the patient's vagina.  A paracervical block of 10cc of 2% lidocaine with epinephrine was placed with 5cc at both 5 adn 7 o'clock.  A single tooth tenaculum was applied to the anterior lip of the cervix.   A 5mm hysteroscope was inserted under direct visualization using normal saline as a distending medium.  The uterine cavity was carefully examined, both ostia were recognized, and  proliferative endometrium was noted.  A large polyp was seen within the cavity and no IUD was visualized anywhere within the cavity. The tenaculum was removed from the anterior lip of the cervix and the vaginal speculum was removed after noting good hemostasis.  The patient tolerated the procedure well.   There were no immediate complications.   1. Intrauterine contraceptive device threads lost, initial encounter No IUD visualized.  Patient reports history of increasing menstrual flow and duration, and thought it was just due to her IUD being due for exchange.  Patient has not seen IUD fall out.  Last documented position was in 2022 on a pelvic x-ray.  Order for abdominal x-ray to assess if IUD and other intra-abdominal space, but suspect that IUD likely fell out with heavy menstrual bleeding and polyp. - DG Abd 1 View; Future  2. Endometrial polyp Large endometrial polyp seen on hysteroscopy in office today.  Recommend removal with IUD insertion in the operating room.  Patient reports she has a 10-day trip in [place] starting October 3, would like to have  procedure before then if able.  Lorriane Shire, MD, FACOG Minimally Invasive Gynecologic Surgery  Obstetrics and Gynecology, [facility] for [facility], [facility] 02/12/2023

## 2023-02-23 ENCOUNTER — Encounter (HOSPITAL_BASED_OUTPATIENT_CLINIC_OR_DEPARTMENT_OTHER): Payer: Self-pay | Admitting: Obstetrics and Gynecology

## 2023-02-24 ENCOUNTER — Encounter (HOSPITAL_BASED_OUTPATIENT_CLINIC_OR_DEPARTMENT_OTHER): Payer: Self-pay | Admitting: Obstetrics and Gynecology

## 2023-02-24 NOTE — Progress Notes (Signed)
Spoke w/ via phone for pre-op interview--- pt Lab needs dos---- istat, EKG urine preg              Lab results------ no COVID test -----patient states asymptomatic no test needed Arrive at ------- 0930 on 03-08-2023 NPO after MN NO Solid Food.  Clear liquids from MN until--- 0830 Med rec completed Medications to take morning of surgery ----- lexapro, crestor, prilosec Diabetic medication ----- n/a Patient instructed no nail polish to be worn day of surgery Patient instructed to bring photo id and insurance card day of surgery Patient aware to have Driver (ride ) / caregiver    for 24 hours after surgery --- husband, Kari Lopez Patient Special Instructions ----- n/a Pre-Op special Instructions ----- n/a Patient verbalized understanding of instructions that were given at this phone interview. Patient denies shortness of breath, chest pain, fever, cough at this phone interview.

## 2023-03-05 ENCOUNTER — Telehealth: Payer: Self-pay

## 2023-03-05 NOTE — Telephone Encounter (Signed)
Patient says she needs to cancel her procedure on 03/08/23. Stated she received an estimate $16,000 for procedure w/ Dr. Briscoe Deutscher. I called the OR and cancelled procedure.

## 2023-03-08 ENCOUNTER — Ambulatory Visit (HOSPITAL_BASED_OUTPATIENT_CLINIC_OR_DEPARTMENT_OTHER)
Admission: RE | Admit: 2023-03-08 | Payer: No Typology Code available for payment source | Source: Home / Self Care | Admitting: Obstetrics and Gynecology

## 2023-03-08 DIAGNOSIS — N939 Abnormal uterine and vaginal bleeding, unspecified: Secondary | ICD-10-CM

## 2023-03-08 DIAGNOSIS — Z01818 Encounter for other preprocedural examination: Secondary | ICD-10-CM

## 2023-03-08 HISTORY — DX: Polyp of corpus uteri: N84.0

## 2023-03-08 HISTORY — DX: Presence of spectacles and contact lenses: Z97.3

## 2023-03-08 HISTORY — DX: Abnormal uterine and vaginal bleeding, unspecified: N93.9

## 2023-03-08 HISTORY — DX: Anxiety disorder, unspecified: F41.9

## 2023-03-08 HISTORY — DX: Unspecified osteoarthritis, unspecified site: M19.90

## 2023-03-08 HISTORY — DX: Hyperlipidemia, unspecified: E78.5

## 2023-03-08 SURGERY — DILATATION AND CURETTAGE /HYSTEROSCOPY
Anesthesia: Choice

## 2023-03-18 ENCOUNTER — Other Ambulatory Visit: Payer: Self-pay | Admitting: Family Medicine

## 2023-03-18 DIAGNOSIS — R232 Flushing: Secondary | ICD-10-CM

## 2023-03-18 DIAGNOSIS — I10 Essential (primary) hypertension: Secondary | ICD-10-CM

## 2023-03-18 DIAGNOSIS — F320 Major depressive disorder, single episode, mild: Secondary | ICD-10-CM

## 2023-03-18 MED ORDER — HYDROCHLOROTHIAZIDE 25 MG PO TABS: 25.0000 mg | ORAL_TABLET | Freq: Every day | ORAL | 0 refills | Status: AC

## 2023-03-18 MED ORDER — ESCITALOPRAM OXALATE 10 MG PO TABS
10.0000 mg | ORAL_TABLET | Freq: Every day | ORAL | 0 refills | Status: DC
Start: 2023-03-18 — End: 2023-04-19

## 2023-04-16 ENCOUNTER — Other Ambulatory Visit: Payer: Self-pay | Admitting: Family Medicine

## 2023-04-16 DIAGNOSIS — F320 Major depressive disorder, single episode, mild: Secondary | ICD-10-CM

## 2023-04-16 DIAGNOSIS — R232 Flushing: Secondary | ICD-10-CM

## 2023-04-27 ENCOUNTER — Ambulatory Visit: Payer: 59 | Admitting: Family Medicine

## 2023-05-20 ENCOUNTER — Other Ambulatory Visit: Payer: Self-pay | Admitting: Family Medicine

## 2023-05-20 DIAGNOSIS — K219 Gastro-esophageal reflux disease without esophagitis: Secondary | ICD-10-CM

## 2023-06-08 ENCOUNTER — Ambulatory Visit: Payer: Managed Care, Other (non HMO) | Admitting: Family Medicine

## 2023-06-08 ENCOUNTER — Encounter: Payer: Self-pay | Admitting: Family Medicine

## 2023-06-08 VITALS — BP 130/98 | HR 81 | Temp 98.7°F | Resp 18 | Ht 67.0 in | Wt 222.2 lb

## 2023-06-08 DIAGNOSIS — R232 Flushing: Secondary | ICD-10-CM | POA: Diagnosis not present

## 2023-06-08 DIAGNOSIS — Z0000 Encounter for general adult medical examination without abnormal findings: Secondary | ICD-10-CM | POA: Diagnosis not present

## 2023-06-08 DIAGNOSIS — E785 Hyperlipidemia, unspecified: Secondary | ICD-10-CM | POA: Diagnosis not present

## 2023-06-08 DIAGNOSIS — M25552 Pain in left hip: Secondary | ICD-10-CM

## 2023-06-08 DIAGNOSIS — Z1211 Encounter for screening for malignant neoplasm of colon: Secondary | ICD-10-CM

## 2023-06-08 DIAGNOSIS — F320 Major depressive disorder, single episode, mild: Secondary | ICD-10-CM | POA: Diagnosis not present

## 2023-06-08 MED ORDER — ESCITALOPRAM OXALATE 10 MG PO TABS
10.0000 mg | ORAL_TABLET | Freq: Every day | ORAL | 3 refills | Status: DC
Start: 2023-06-08 — End: 2023-09-28

## 2023-06-08 MED ORDER — METHOCARBAMOL 500 MG PO TABS
500.0000 mg | ORAL_TABLET | Freq: Four times a day (QID) | ORAL | 1 refills | Status: AC
Start: 2023-06-08 — End: ?

## 2023-06-08 NOTE — Patient Instructions (Signed)
Preventive Care 40-53 Years Old, Female Preventive care refers to lifestyle choices and visits with your health care provider that can promote health and wellness. Preventive care visits are also called wellness exams. What can I expect for my preventive care visit? Counseling Your health care provider may ask you questions about your: Medical history, including: Past medical problems. Family medical history. Pregnancy history. Current health, including: Menstrual cycle. Method of birth control. Emotional well-being. Home life and relationship well-being. Sexual activity and sexual health. Lifestyle, including: Alcohol, nicotine or tobacco, and drug use. Access to firearms. Diet, exercise, and sleep habits. Work and work environment. Sunscreen use. Safety issues such as seatbelt and bike helmet use. Physical exam Your health care provider will check your: Height and weight. These may be used to calculate your BMI (body mass index). BMI is a measurement that tells if you are at a healthy weight. Waist circumference. This measures the distance around your waistline. This measurement also tells if you are at a healthy weight and may help predict your risk of certain diseases, such as type 2 diabetes and high blood pressure. Heart rate and blood pressure. Body temperature. Skin for abnormal spots. What immunizations do I need?  Vaccines are usually given at various ages, according to a schedule. Your health care provider will recommend vaccines for you based on your age, medical history, and lifestyle or other factors, such as travel or where you work. What tests do I need? Screening Your health care provider may recommend screening tests for certain conditions. This may include: Lipid and cholesterol levels. Diabetes screening. This is done by checking your blood sugar (glucose) after you have not eaten for a while (fasting). Pelvic exam and Pap test. Hepatitis B test. Hepatitis C  test. HIV (human immunodeficiency virus) test. STI (sexually transmitted infection) testing, if you are at risk. Lung cancer screening. Colorectal cancer screening. Mammogram. Talk with your health care provider about when you should start having regular mammograms. This may depend on whether you have a family history of breast cancer. BRCA-related cancer screening. This may be done if you have a family history of breast, ovarian, tubal, or peritoneal cancers. Bone density scan. This is done to screen for osteoporosis. Talk with your health care provider about your test results, treatment options, and if necessary, the need for more tests. Follow these instructions at home: Eating and drinking  Eat a diet that includes fresh fruits and vegetables, whole grains, lean protein, and low-fat dairy products. Take vitamin and mineral supplements as recommended by your health care provider. Do not drink alcohol if: Your health care provider tells you not to drink. You are pregnant, may be pregnant, or are planning to become pregnant. If you drink alcohol: Limit how much you have to 0-1 drink a day. Know how much alcohol is in your drink. In the [place] one drink equals one 12 oz bottle of beer (355 mL), one 5 oz glass of wine (148 mL), or one 1 oz glass of hard liquor (44 mL). Lifestyle Brush your teeth every morning and night with fluoride toothpaste. Floss one time each day. Exercise for at least 30 minutes 5 or more days each week. Do not use any products that contain nicotine or tobacco. These products include cigarettes, chewing tobacco, and vaping devices, such as e-cigarettes. If you need help quitting, ask your health care provider. Do not use drugs. If you are sexually active, practice safe sex. Use a condom or other form of protection to   prevent STIs. If you do not wish to become pregnant, use a form of birth control. If you plan to become pregnant, see your health care provider for a  prepregnancy visit. Take aspirin only as told by your health care provider. Make sure that you understand how much to take and what form to take. Work with your health care provider to find out whether it is safe and beneficial for you to take aspirin daily. Find healthy ways to manage stress, such as: Meditation, yoga, or listening to music. Journaling. Talking to a trusted person. Spending time with friends and family. Minimize exposure to UV radiation to reduce your risk of skin cancer. Safety Always wear your seat belt while driving or riding in a vehicle. Do not drive: If you have been drinking alcohol. Do not ride with someone who has been drinking. When you are tired or distracted. While texting. If you have been using any mind-altering substances or drugs. Wear a helmet and other protective equipment during sports activities. If you have firearms in your house, make sure you follow all gun safety procedures. Seek help if you have been physically or sexually abused. What's next? Visit your health care provider once a year for an annual wellness visit. Ask your health care provider how often you should have your eyes and teeth checked. Stay up to date on all vaccines. This information is not intended to replace advice given to you by your health care provider. Make sure you discuss any questions you have with your health care provider. Document Revised: 12/11/2020 Document Reviewed: 12/11/2020 Elsevier Patient Education  2024 Elsevier Inc.  

## 2023-06-08 NOTE — Assessment & Plan Note (Signed)
[facility] Check labs   Health Maintenance  Topic Date Due   Colonoscopy  Never done   Cervical Cancer Screening (HPV/Pap Cotest)  07/07/2016   MAMMOGRAM  12/31/2021   COVID-19 Vaccine (3 - 2023-24 season) 06/24/2023 (Originally 02/28/2023)   INFLUENZA VACCINE  09/27/2023 (Originally 01/28/2023)   DTaP/Tdap/Td (3 - Td or Tdap) 02/10/2032   Hepatitis C Screening  Completed   HIV Screening  Completed   Zoster Vaccines- Shingrix  Completed   HPV VACCINES  Aged Out
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
## 2023-06-09 LAB — CBC WITH DIFFERENTIAL/PLATELET
Basophils Absolute: 0.1 10*3/uL (ref 0.0–0.1)
Basophils Relative: 1 % (ref 0.0–3.0)
Eosinophils Absolute: 0.1 10*3/uL (ref 0.0–0.7)
Eosinophils Relative: 1.9 % (ref 0.0–5.0)
HCT: 39.4 % (ref 36.0–46.0)
Hemoglobin: 12.3 g/dL (ref 12.0–15.0)
Lymphocytes Relative: 27.8 % (ref 12.0–46.0)
Lymphs Abs: 1.9 10*3/uL (ref 0.7–4.0)
MCHC: 31.2 g/dL (ref 30.0–36.0)
MCV: 79.7 fL (ref 78.0–100.0)
Monocytes Absolute: 0.8 10*3/uL (ref 0.1–1.0)
Monocytes Relative: 11.2 % (ref 3.0–12.0)
Neutro Abs: 3.9 10*3/uL (ref 1.4–7.7)
Neutrophils Relative %: 58.1 % (ref 43.0–77.0)
Platelets: 352 10*3/uL (ref 150.0–400.0)
RBC: 4.94 Mil/uL (ref 3.87–5.11)
RDW: 18.1 % — ABNORMAL HIGH (ref 11.5–15.5)
WBC: 6.8 10*3/uL (ref 4.0–10.5)

## 2023-06-09 LAB — COMPREHENSIVE METABOLIC PANEL
ALT: 26 U/L (ref 0–35)
AST: 25 U/L (ref 0–37)
Albumin: 4.8 g/dL (ref 3.5–5.2)
Alkaline Phosphatase: 64 U/L (ref 39–117)
BUN: 16 mg/dL (ref 6–23)
CO2: 30 meq/L (ref 19–32)
Calcium: 9.7 mg/dL (ref 8.4–10.5)
Chloride: 100 meq/L (ref 96–112)
Creatinine, Ser: 0.73 mg/dL (ref 0.40–1.20)
GFR: 94.04 mL/min (ref >60.00)
Glucose, Bld: 98 mg/dL (ref 70–99)
Potassium: 3.9 meq/L (ref 3.5–5.1)
Sodium: 141 meq/L (ref 135–145)
Total Bilirubin: 0.6 mg/dL (ref 0.2–1.2)
Total Protein: 7.2 g/dL (ref 6.0–8.3)

## 2023-06-09 LAB — LIPID PANEL
Cholesterol: 221 mg/dL — ABNORMAL HIGH (ref 0–200)
HDL: 58.1 mg/dL (ref >39.00)
LDL Cholesterol: 139 mg/dL — ABNORMAL HIGH (ref 0–99)
NonHDL: 162.59
Total CHOL/HDL Ratio: 4
Triglycerides: 117 mg/dL (ref 0.0–149.0)
VLDL: 23.4 mg/dL (ref 0.0–40.0)

## 2023-06-09 LAB — TSH: TSH: 2 u[IU]/mL (ref 0.35–5.50)

## 2023-06-17 ENCOUNTER — Encounter: Payer: Self-pay | Admitting: Family Medicine

## 2023-06-18 ENCOUNTER — Telehealth: Payer: Self-pay

## 2023-06-18 ENCOUNTER — Other Ambulatory Visit: Payer: Self-pay | Admitting: Family Medicine

## 2023-06-18 DIAGNOSIS — R7303 Prediabetes: Secondary | ICD-10-CM

## 2023-06-18 MED ORDER — WEGOVY 0.25 MG/0.5ML ~~LOC~~ SOAJ: 0.2500 mg | SUBCUTANEOUS | 0 refills | Status: DC

## 2023-06-18 MED ORDER — OPILL 0.075 MG PO TABS: ORAL_TABLET | ORAL | 5 refills | Status: DC

## 2023-06-18 NOTE — Telephone Encounter (Signed)
PA initiated via Covermymeds; KEY: [id number]. Awaiting determination.

## 2023-06-18 NOTE — Telephone Encounter (Signed)
PA denied. Plan exclusion.   The requested drug is not covered by the plan. Please contact member services for further assistance. Reviewed by: R.Ph.

## 2023-06-19 ENCOUNTER — Other Ambulatory Visit: Payer: Self-pay | Admitting: Family Medicine

## 2023-06-19 DIAGNOSIS — K219 Gastro-esophageal reflux disease without esophagitis: Secondary | ICD-10-CM

## 2023-06-21 ENCOUNTER — Other Ambulatory Visit: Payer: Self-pay

## 2023-06-21 DIAGNOSIS — K219 Gastro-esophageal reflux disease without esophagitis: Secondary | ICD-10-CM

## 2023-06-21 MED ORDER — OMEPRAZOLE 20 MG PO CPDR
20.0000 mg | DELAYED_RELEASE_CAPSULE | Freq: Every day | ORAL | 1 refills | Status: DC
Start: 2023-06-21 — End: 2023-09-28

## 2023-06-21 NOTE — Telephone Encounter (Signed)
Called and let patient know.  She stated she will do an appeal and see if we can get it to go thru then

## 2023-06-28 ENCOUNTER — Other Ambulatory Visit: Payer: Self-pay

## 2023-07-04 ENCOUNTER — Encounter: Payer: Self-pay | Admitting: Family Medicine

## 2023-07-04 DIAGNOSIS — F320 Major depressive disorder, single episode, mild: Secondary | ICD-10-CM

## 2023-07-04 DIAGNOSIS — R232 Flushing: Secondary | ICD-10-CM

## 2023-07-04 DIAGNOSIS — I10 Essential (primary) hypertension: Secondary | ICD-10-CM

## 2023-07-05 MED ORDER — ROSUVASTATIN CALCIUM 20 MG PO TABS: 20.0000 mg | ORAL_TABLET | Freq: Every day | ORAL | 1 refills | Status: DC

## 2023-07-05 MED ORDER — LOSARTAN POTASSIUM 100 MG PO TABS
100.0000 mg | ORAL_TABLET | Freq: Every day | ORAL | 1 refills | Status: DC
Start: 2023-07-05 — End: 2023-09-28
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
## 2023-08-23 IMAGING — DX DG ELBOW COMPLETE 3+V*L*
4 series · 4 of 4 positions shown · non-contrast
Comparison: None.

CLINICAL DATA: Left elbow pain

EXAM:
LEFT ELBOW - COMPLETE 3+ VIEW

[elbow ap]
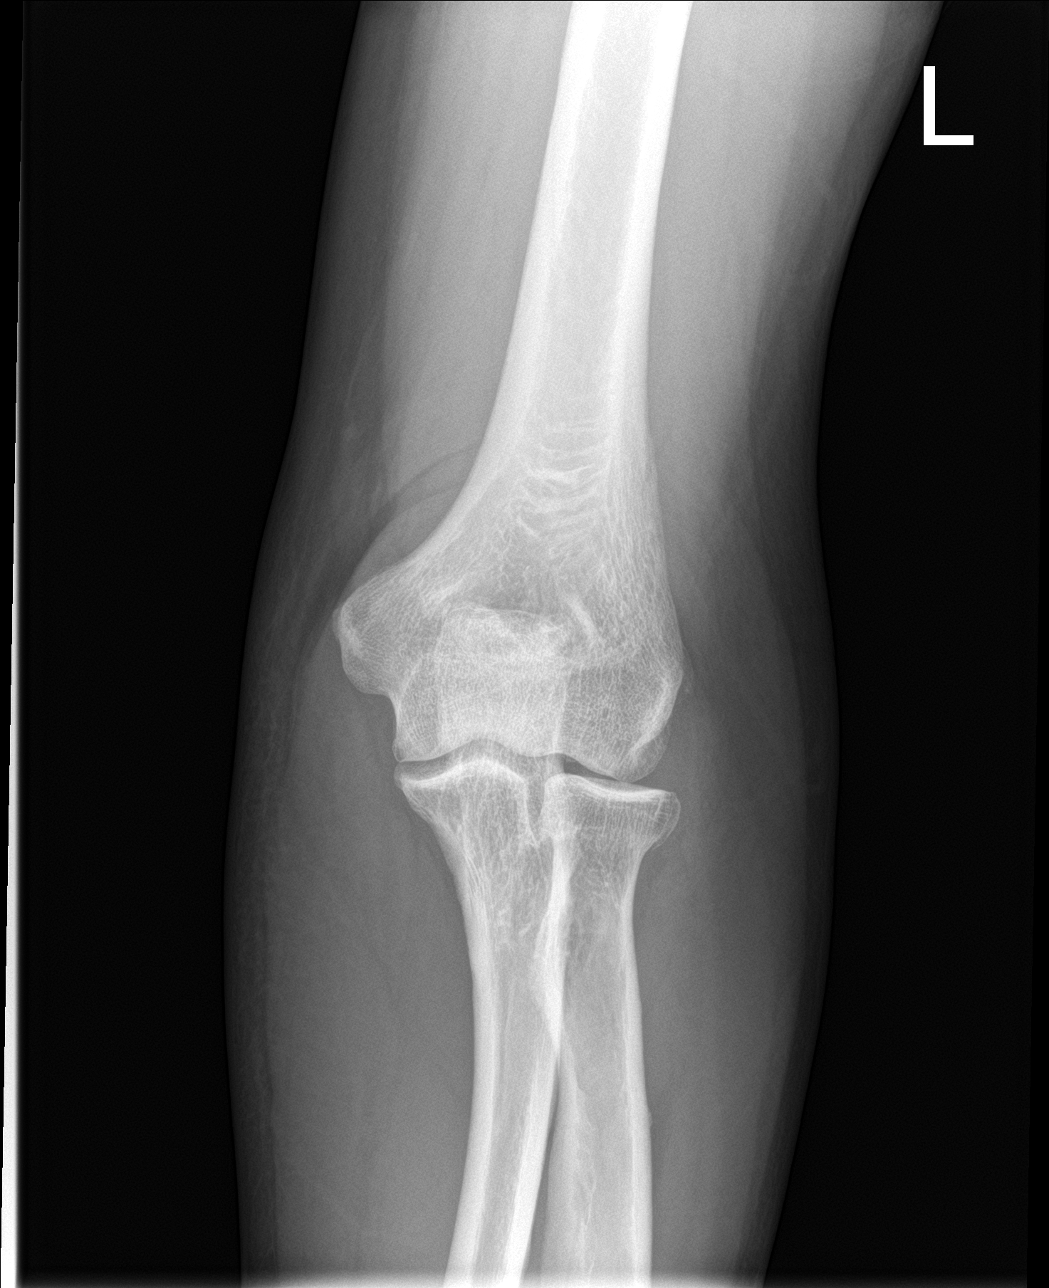

[elbow obl (1 of 2)]
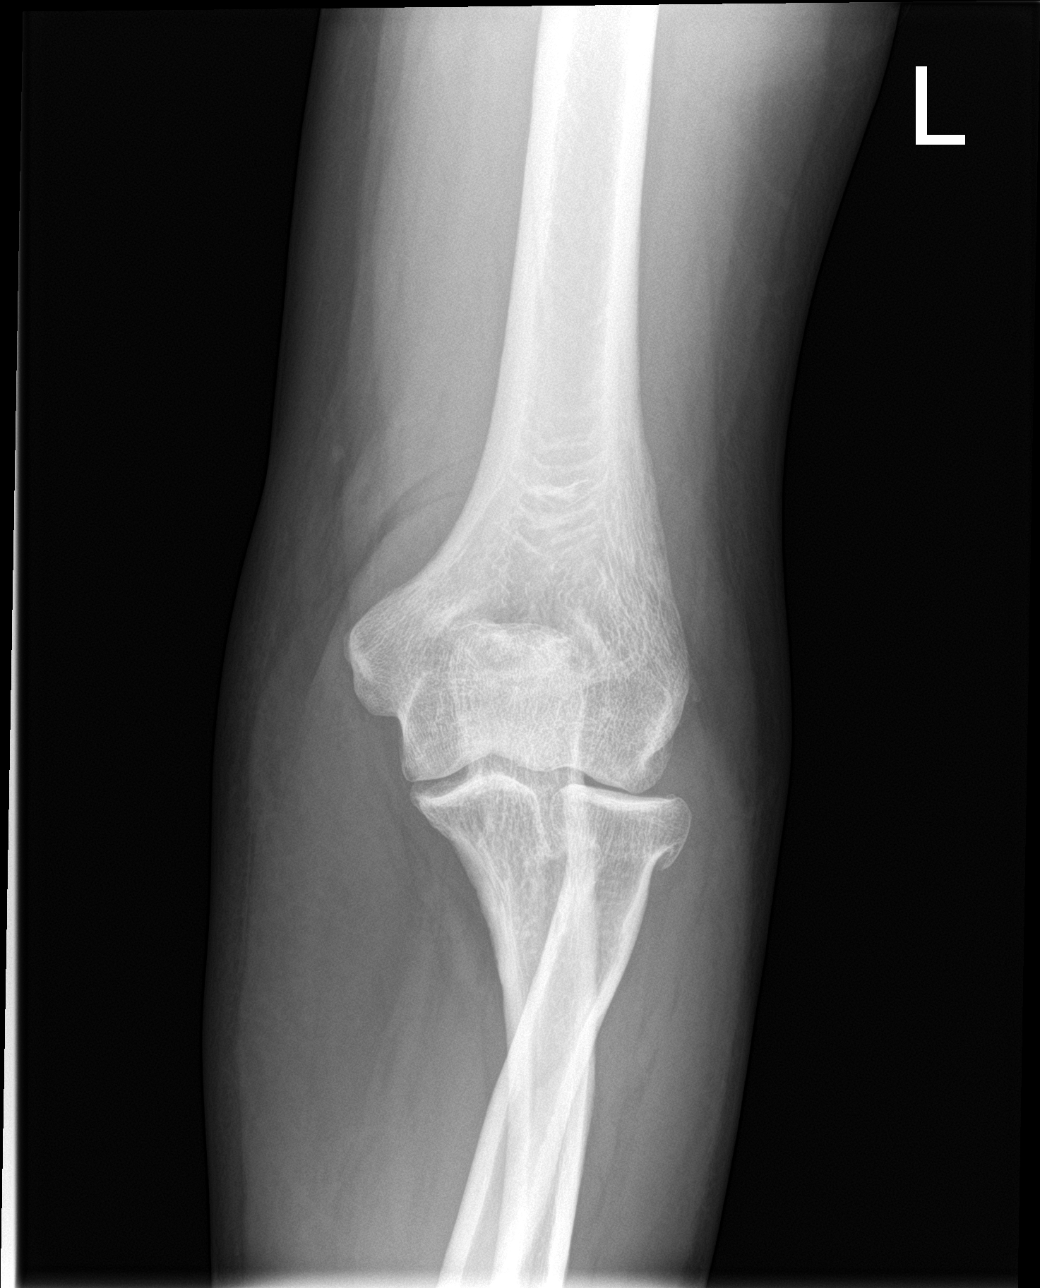

[elbow obl (2 of 2)]
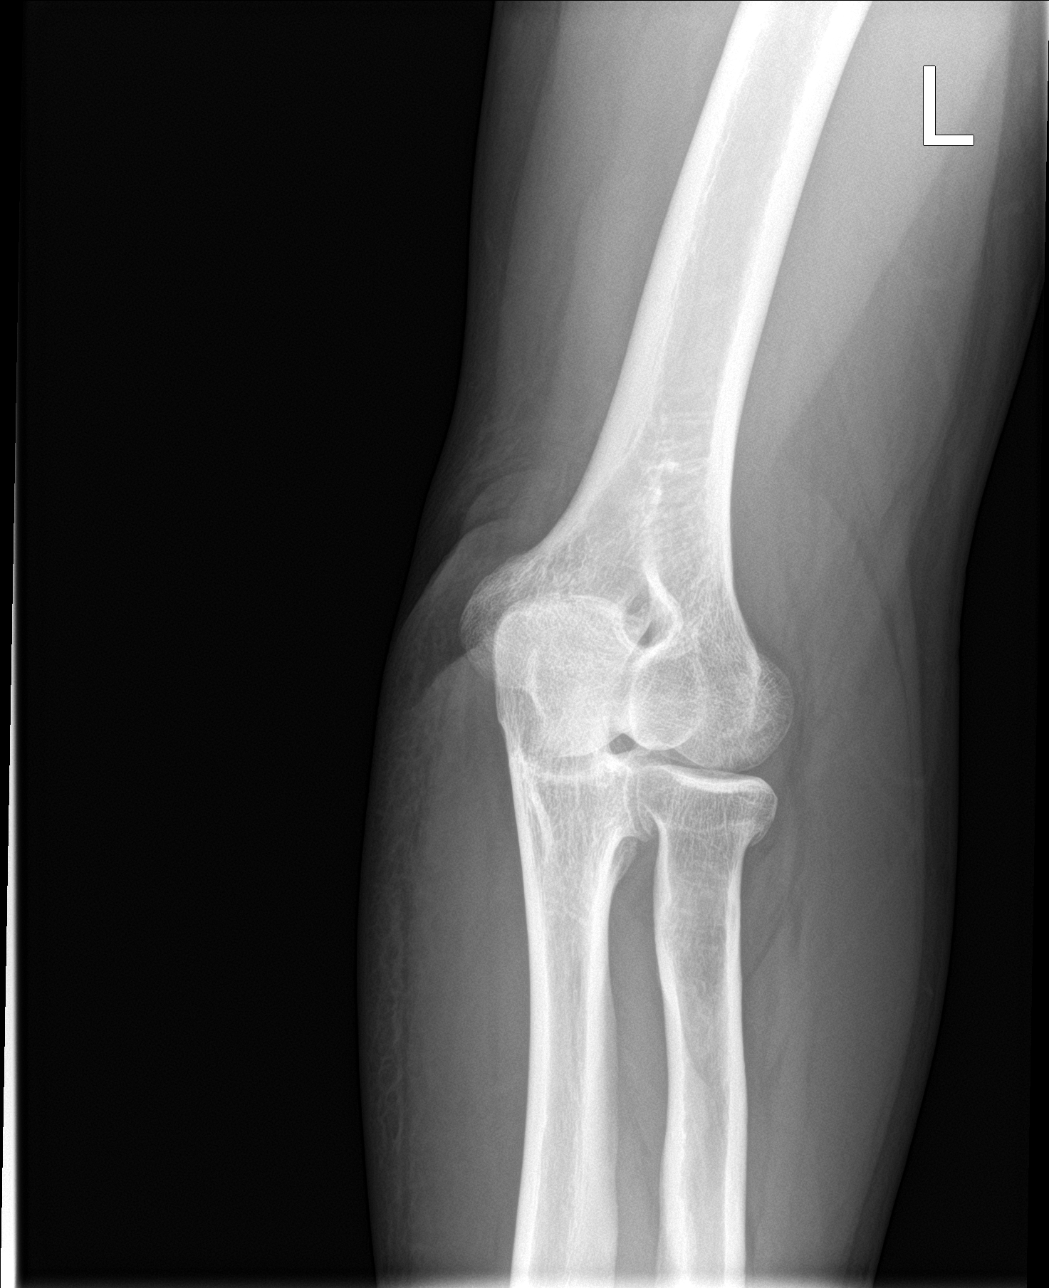

[elbow lat]
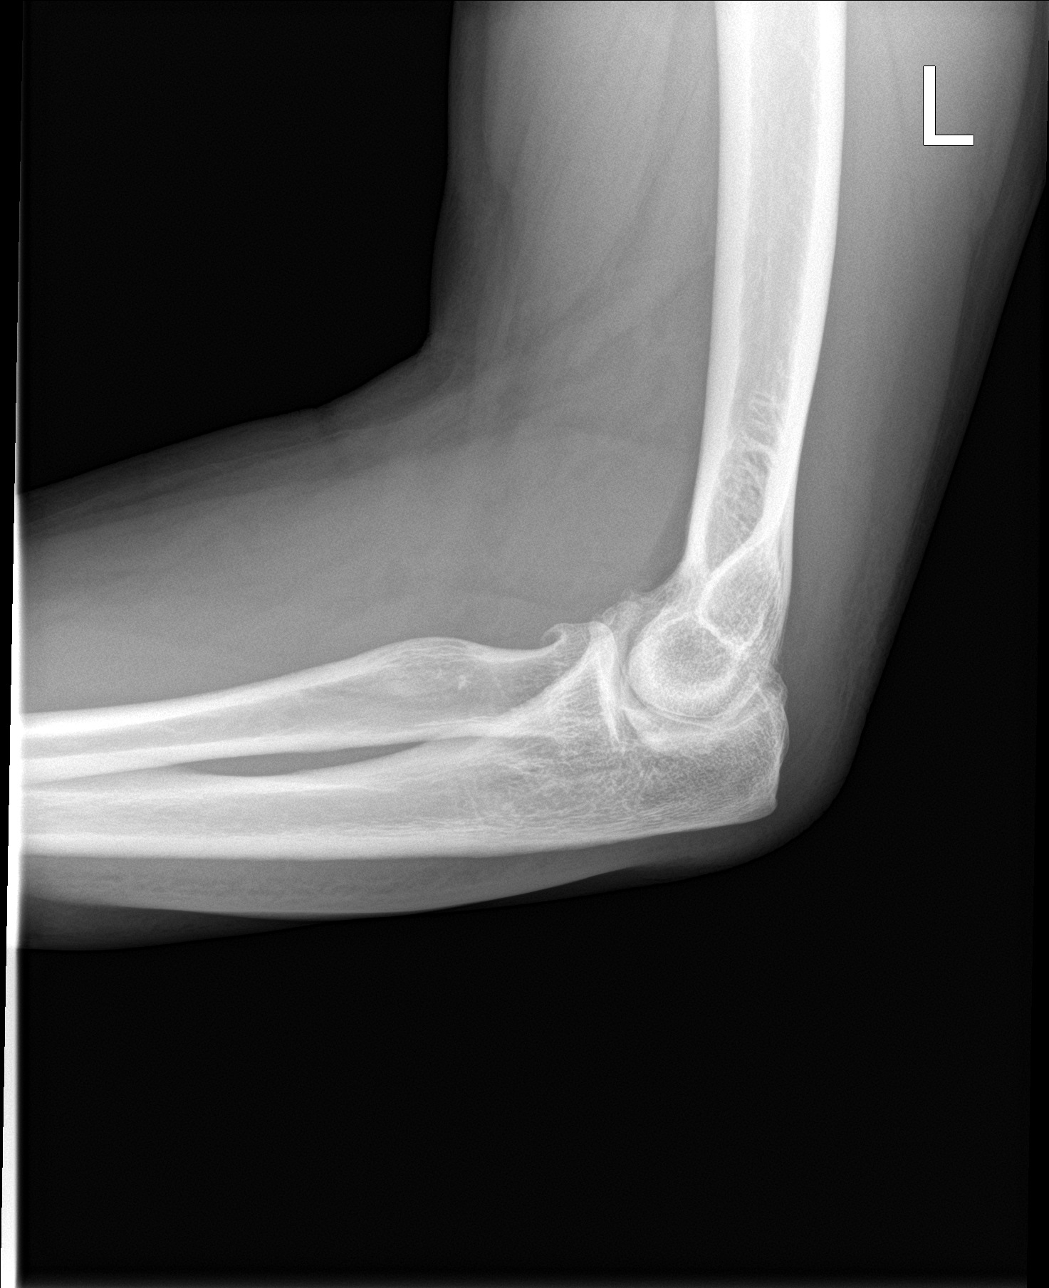

[4 of 4 positions shown; findings below may reference images not displayed]

FINDINGS: Small elbow joint effusion and soft tissue swelling of the elbow. No
visible fracture. No dislocation.

Mild degenerative changes of the left elbow joint with ring
osteophytes of the radial head.
IMPRESSION: Small elbow joint effusion, concerning for nondisplaced radial head
fracture.

These results will be called to the ordering clinician or
representative by the Radiologist Assistant, and communication
documented in the PACS or [REDACTED].

## 2023-09-27 ENCOUNTER — Ambulatory Visit: Admitting: Family Medicine

## 2023-09-27 ENCOUNTER — Encounter: Payer: Self-pay | Admitting: Family Medicine

## 2023-09-27 DIAGNOSIS — K219 Gastro-esophageal reflux disease without esophagitis: Secondary | ICD-10-CM

## 2023-09-28 ENCOUNTER — Other Ambulatory Visit: Payer: Self-pay | Admitting: Family Medicine

## 2023-09-28 DIAGNOSIS — R232 Flushing: Secondary | ICD-10-CM

## 2023-09-28 DIAGNOSIS — I10 Essential (primary) hypertension: Secondary | ICD-10-CM

## 2023-09-28 DIAGNOSIS — F320 Major depressive disorder, single episode, mild: Secondary | ICD-10-CM

## 2023-09-28 MED ORDER — OMEPRAZOLE 20 MG PO CPDR
20.0000 mg | DELAYED_RELEASE_CAPSULE | Freq: Every day | ORAL | 1 refills | Status: DC
Start: 2023-09-28 — End: 2024-02-24

## 2023-09-28 MED ORDER — CARBOXYMETHYLCELLULOSE SODIUM 1 % OP GEL: OPHTHALMIC | 1 refills | Status: AC

## 2023-12-07 ENCOUNTER — Other Ambulatory Visit: Payer: Self-pay | Admitting: Family Medicine

## 2024-01-29 ENCOUNTER — Other Ambulatory Visit: Payer: Self-pay | Admitting: Family Medicine

## 2024-02-24 ENCOUNTER — Other Ambulatory Visit: Payer: Self-pay | Admitting: Family Medicine

## 2024-02-24 DIAGNOSIS — K219 Gastro-esophageal reflux disease without esophagitis: Secondary | ICD-10-CM

## 2024-02-24 DIAGNOSIS — I10 Essential (primary) hypertension: Secondary | ICD-10-CM

## 2024-02-24 DIAGNOSIS — R232 Flushing: Secondary | ICD-10-CM

## 2024-02-24 DIAGNOSIS — F320 Major depressive disorder, single episode, mild: Secondary | ICD-10-CM

## 2024-03-19 ENCOUNTER — Other Ambulatory Visit: Payer: Self-pay | Admitting: Family Medicine

## 2024-03-19 DIAGNOSIS — I10 Essential (primary) hypertension: Secondary | ICD-10-CM

## 2024-03-27 ENCOUNTER — Other Ambulatory Visit: Payer: Self-pay | Admitting: Family Medicine

## 2024-03-27 DIAGNOSIS — K219 Gastro-esophageal reflux disease without esophagitis: Secondary | ICD-10-CM

## 2024-03-27 DIAGNOSIS — R232 Flushing: Secondary | ICD-10-CM

## 2024-03-27 DIAGNOSIS — F320 Major depressive disorder, single episode, mild: Secondary | ICD-10-CM

## 2024-04-04 ENCOUNTER — Other Ambulatory Visit: Payer: Self-pay | Admitting: Family Medicine

## 2024-04-04 DIAGNOSIS — I10 Essential (primary) hypertension: Secondary | ICD-10-CM

## 2024-04-08 ENCOUNTER — Telehealth: Payer: Self-pay | Admitting: Family Medicine

## 2024-04-08 DIAGNOSIS — I10 Essential (primary) hypertension: Secondary | ICD-10-CM

## 2024-04-10 NOTE — Telephone Encounter (Signed)
 I left a voicemail for them schedule

## 2024-04-10 NOTE — Telephone Encounter (Signed)
 Needs appt for further refills please. Can you call Pt please?

## 2024-04-14 ENCOUNTER — Ambulatory Visit: Admitting: Family Medicine

## 2024-04-18 ENCOUNTER — Ambulatory Visit: Admitting: Family Medicine

## 2024-04-25 ENCOUNTER — Ambulatory Visit: Admitting: Family Medicine

## 2024-04-27 ENCOUNTER — Other Ambulatory Visit: Payer: Self-pay | Admitting: Family Medicine

## 2024-04-27 DIAGNOSIS — R232 Flushing: Secondary | ICD-10-CM

## 2024-04-27 DIAGNOSIS — K219 Gastro-esophageal reflux disease without esophagitis: Secondary | ICD-10-CM

## 2024-04-27 DIAGNOSIS — F320 Major depressive disorder, single episode, mild: Secondary | ICD-10-CM

## 2024-05-01 ENCOUNTER — Other Ambulatory Visit: Payer: Self-pay | Admitting: Family Medicine

## 2024-05-05 ENCOUNTER — Ambulatory Visit: Admitting: Family Medicine

## 2024-05-05 VITALS — BP 100/80 | HR 64 | Temp 98.3°F | Resp 16 | Ht 67.0 in | Wt 218.8 lb

## 2024-05-05 DIAGNOSIS — M79671 Pain in right foot: Secondary | ICD-10-CM | POA: Diagnosis not present

## 2024-05-05 DIAGNOSIS — M79645 Pain in left finger(s): Secondary | ICD-10-CM | POA: Diagnosis not present

## 2024-05-05 DIAGNOSIS — I10 Essential (primary) hypertension: Secondary | ICD-10-CM | POA: Diagnosis not present

## 2024-05-05 DIAGNOSIS — R7303 Prediabetes: Secondary | ICD-10-CM

## 2024-05-05 DIAGNOSIS — N3941 Urge incontinence: Secondary | ICD-10-CM

## 2024-05-05 DIAGNOSIS — E785 Hyperlipidemia, unspecified: Secondary | ICD-10-CM

## 2024-05-05 DIAGNOSIS — Z800 Family history of malignant neoplasm of digestive organs: Secondary | ICD-10-CM

## 2024-05-05 DIAGNOSIS — G8929 Other chronic pain: Secondary | ICD-10-CM | POA: Diagnosis not present

## 2024-05-05 DIAGNOSIS — Z1211 Encounter for screening for malignant neoplasm of colon: Secondary | ICD-10-CM | POA: Diagnosis not present

## 2024-05-05 DIAGNOSIS — M25512 Pain in left shoulder: Secondary | ICD-10-CM

## 2024-05-05 DIAGNOSIS — M25552 Pain in left hip: Secondary | ICD-10-CM | POA: Diagnosis not present

## 2024-05-05 LAB — COMPREHENSIVE METABOLIC PANEL WITH GFR
ALT: 24 U/L (ref 0–35)
AST: 20 U/L (ref 0–37)
Albumin: 4.4 g/dL (ref 3.5–5.2)
Alkaline Phosphatase: 66 U/L (ref 39–117)
BUN: 16 mg/dL (ref 6–23)
CO2: 27 meq/L (ref 19–32)
Calcium: 8.9 mg/dL (ref 8.4–10.5)
Chloride: 105 meq/L (ref 96–112)
Creatinine, Ser: 0.55 mg/dL (ref 0.40–1.20)
GFR: 104.15 mL/min (ref >60.00)
Glucose, Bld: 91 mg/dL (ref 70–99)
Potassium: 4.3 meq/L (ref 3.5–5.1)
Sodium: 141 meq/L (ref 135–145)
Total Bilirubin: 0.6 mg/dL (ref 0.2–1.2)
Total Protein: 6.4 g/dL (ref 6.0–8.3)

## 2024-05-05 LAB — CBC WITH DIFFERENTIAL/PLATELET
Basophils Absolute: 0 K/uL (ref 0.0–0.1)
Basophils Relative: 0.4 % (ref 0.0–3.0)
Eosinophils Absolute: 0.1 K/uL (ref 0.0–0.7)
Eosinophils Relative: 1.9 % (ref 0.0–5.0)
HCT: 40.6 % (ref 36.0–46.0)
Hemoglobin: 13.7 g/dL (ref 12.0–15.0)
Lymphocytes Relative: 24.1 % (ref 12.0–46.0)
Lymphs Abs: 1.5 K/uL (ref 0.7–4.0)
MCHC: 33.9 g/dL (ref 30.0–36.0)
MCV: 88 fl (ref 78.0–100.0)
Monocytes Absolute: 0.8 K/uL (ref 0.1–1.0)
Monocytes Relative: 12.1 % — ABNORMAL HIGH (ref 3.0–12.0)
Neutro Abs: 3.8 K/uL (ref 1.4–7.7)
Neutrophils Relative %: 61.5 % (ref 43.0–77.0)
Platelets: 280 K/uL (ref 150.0–400.0)
RBC: 4.61 Mil/uL (ref 3.87–5.11)
RDW: 13.6 % (ref 11.5–15.5)
WBC: 6.3 K/uL (ref 4.0–10.5)

## 2024-05-05 LAB — POC URINALSYSI DIPSTICK (AUTOMATED)
Bilirubin, UA: NEGATIVE
Blood, UA: NEGATIVE
Glucose, UA: NEGATIVE
Ketones, UA: NEGATIVE
Leukocytes, UA: NEGATIVE
Nitrite, UA: NEGATIVE
Protein, UA: NEGATIVE
Spec Grav, UA: 1.015 (ref 1.010–1.025)
Urobilinogen, UA: 0.2 U/dL
pH, UA: 6 (ref 5.0–8.0)

## 2024-05-05 LAB — LIPID PANEL
Cholesterol: 138 mg/dL (ref 0–200)
HDL: 57.8 mg/dL (ref >39.00)
LDL Cholesterol: 58 mg/dL (ref 0–99)
NonHDL: 80.54
Total CHOL/HDL Ratio: 2
Triglycerides: 112 mg/dL (ref 0.0–149.0)
VLDL: 22.4 mg/dL (ref 0.0–40.0)

## 2024-05-05 LAB — HEMOGLOBIN A1C: Hgb A1c MFr Bld: 5.8 % (ref 4.6–6.5)

## 2024-05-05 MED ORDER — CELECOXIB 200 MG PO CAPS: 200.0000 mg | ORAL_CAPSULE | Freq: Every day | ORAL | 3 refills | Status: AC

## 2024-05-05 NOTE — Assessment & Plan Note (Signed)
 Refer to ortho.

## 2024-05-05 NOTE — Assessment & Plan Note (Signed)
 Well controlled, no changes to meds. Encouraged heart healthy diet such as the DASH diet and exercise as tolerated.

## 2024-05-05 NOTE — Assessment & Plan Note (Signed)
 Pt will check with ins co about weight loss meds

## 2024-05-05 NOTE — Assessment & Plan Note (Signed)
 Refer to ortho --- ? Tendonitis  Rest , ice---  splint as needed

## 2024-05-05 NOTE — Assessment & Plan Note (Signed)
 Encourage heart healthy diet such as MIND or DASH diet, increase exercise, avoid trans fats, simple carbohydrates and processed foods, consider a krill or fish or flaxseed oil cap daily.

## 2024-05-05 NOTE — Progress Notes (Signed)
 Subjective:    Patient ID: Kari Lopez, female    DOB: 1971-03-06, 53 y.o.   MRN: [id number]  Chief Complaint  Patient presents with   Hyperlipidemia   Hypertension   Follow-up    HPI Patient is in today for f/u chol and bp.  She also c/o L shoulder pain.  Discussed the use of AI scribe software for clinical note transcription with the patient, who gave verbal consent to proceed.  History of Present Illness Kari Lopez is a 53 year old female who presents for medication management and evaluation of joint pain.  She requires a refill of Celebrex , which effectively manages her hip and knee pain. She has a history of knee surgery but has not undergone knee replacement.  She is experiencing issues with obtaining semaglutide  due to insurance coverage problems. It was previously prescribed but not filled due to cost and availability issues.  She describes a painful bump on her right heel that becomes sore and requires her to remove her shoe at work to prevent rubbing. No imaging has been done on the foot.  She reports significant pain in her left shoulder, which she attributes to jerking it at work. The pain is severe enough to prevent her from lifting her arm to point or perform other activities.  She has difficulty with her left thumb, experiencing pain and weakness that affects her grip and causes her to drop things. This has been ongoing for about a year. Her twin sister had a similar issue related to a ligament problem.  She mentions urgency with urination, noting that she needs to go immediately when she feels the urge, which is challenging at work due to the restroom being upstairs. No swelling in the ankles.    Past Medical History:  Diagnosis Date   Abnormal uterine bleeding (AUB)    Anxiety    Depression    Endometrial polyp    GERD (gastroesophageal reflux disease)    Hyperlipidemia    Hypertension    OA (osteoarthritis)    Wears glasses     Past Surgical  History:  Procedure Laterality Date   CARPAL TUNNEL RELEASE Right 10/2006   IRRIGATION AND DEBRIDEMENT ELBOW Left 06/16/2021   Procedure: IRRIGATION AND DEBRIDEMENT LEFT OLECRANON BURSA;  Surgeon: Romona Harari, MD;  Location: [facility];  Service: Orthopedics;  Laterality: Left;   KNEE ARTHROPLASTY Right 02/26/2022   Procedure: COMPUTER ASSISTED TOTAL KNEE ARTHROPLASTY;  Surgeon: Fidel Rogue, MD;  Location: [facility];  Service: Orthopedics;  Laterality: Right;   KNEE ARTHROSCOPY W/ ACL RECONSTRUCTION Right 02/18/2004   ULNAR TUNNEL RELEASE Left 06/16/2021   Procedure: LEFT CUBITAL TUNNEL RELEASE;  Surgeon: Romona Harari, MD;  Location: [facility];  Service: Orthopedics;  Laterality: Left;    Family History  Problem Relation Age of Onset   Colon cancer Father    Healthy Maternal Grandmother    Coronary artery disease Other    Diabetes Other    Hyperlipidemia Other    Hypertension Other    Cancer Other        LUNG    Social History   Socioeconomic History   Marital status: Married    Spouse name: Oneil   Number of children: Not on file   Years of education: Not on file   Highest education level: 12th grade  Occupational History    Employer: [employer]  Tobacco Use   Smoking status: Never   Smokeless tobacco: Never  Vaping  Use   Vaping status: Never Used  Substance and Sexual Activity   Alcohol  use: Yes    Comment: 02-24-2023  average 4 beer daily (12 oz each)   Drug use: Never   Sexual activity: Yes    Partners: Male    Birth control/protection: None  Other Topics Concern   Not on file  Social History Narrative   Exercise--- gym ,  3x a week   Social Drivers of Health   Financial Resource Strain: Low Risk  (05/05/2024)   Overall Financial Resource Strain (CARDIA)    Difficulty of Paying Living Expenses: Not hard at all  Food Insecurity: No Food Insecurity (05/05/2024)   Hunger Vital Sign    Worried About Running Out of  Food in the Last Year: Never true    Ran Out of Food in the Last Year: Never true  Transportation Needs: No Transportation Needs (05/05/2024)   PRAPARE - Administrator, Civil Service (Medical): No    Lack of Transportation (Non-Medical): No  Physical Activity: Inactive (05/05/2024)   Exercise Vital Sign    Days of Exercise per Week: 0 days    Minutes of Exercise per Session: Not on file  Stress: No Stress Concern Present (05/05/2024)   [employer] of Occupational Health - Occupational Stress Questionnaire    Feeling of Stress: Only a little  Social Connections: Socially Isolated (05/05/2024)   Social Connection and Isolation Panel    Frequency of Communication with Friends and Family: Never    Frequency of Social Gatherings with Friends and Family: Never    Attends Religious Services: Never    Database Administrator or Organizations: No    Attends Engineer, Structural: Not on file    Marital Status: Married  Intimate Partner Violence: Unknown (10/02/2021)   Received from [facility]   HITS    Physically Hurt: Not on file    Insult or Talk Down To: Not on file    Threaten Physical Harm: Not on file    Scream or Curse: Not on file    Outpatient Medications Prior to Visit  Medication Sig Dispense Refill   Carboxymethylcellulose Sodium (DRY EYE RELIEF) 1 % GEL Apply to eye as needed 15 mL 1   escitalopram  (LEXAPRO ) 10 MG tablet Take 1 tablet (10 mg total) by mouth daily. Needs appt 30 tablet 0   hydrochlorothiazide  (HYDRODIURIL ) 25 MG tablet Take 1 tablet (25 mg total) by mouth daily. 30 tablet 0   losartan  (COZAAR ) 100 MG tablet Take 1 tablet (100 mg total) by mouth daily. 30 tablet 0   methocarbamol  (ROBAXIN ) 500 MG tablet Take 1 tablet (500 mg total) by mouth 4 (four) times daily. 45 tablet 1   omeprazole  (PRILOSEC ) 20 MG capsule Take 1 capsule (20 mg total) by mouth daily. Needs appt 30 capsule 0   rosuvastatin  (CRESTOR ) 20 MG tablet Take 1 tablet (20 mg  total) by mouth daily. 90 tablet 0   Norgestrel  (OPILL ) 0.075 MG TABS Take 1 tablet by mouth daily. Needs appt 28 each 0   No facility-administered medications prior to visit.    Allergies  Allergen Reactions   Codeine     Unknown childhood reaction    Review of Systems  Constitutional:  Negative for fever and malaise/fatigue.  HENT:  Negative for congestion.   Eyes:  Negative for blurred vision.  Respiratory:  Negative for shortness of breath.   Cardiovascular:  Negative for chest pain, palpitations and leg swelling.  Gastrointestinal:  Negative for abdominal pain, blood in stool and nausea.  Genitourinary:  Negative for dysuria and frequency.  Musculoskeletal:  Negative for falls.  Skin:  Negative for rash.  Neurological:  Negative for dizziness, loss of consciousness and headaches.  Endo/Heme/Allergies:  Negative for environmental allergies.  Psychiatric/Behavioral:  Negative for depression. The patient is not nervous/anxious.        Objective:    Physical Exam Vitals and nursing note reviewed.  Constitutional:      General: She is not in acute distress.    Appearance: Normal appearance. She is well-developed.  HENT:     Head: Normocephalic and atraumatic.  Eyes:     General: No scleral icterus.       Right eye: No discharge.        Left eye: No discharge.  Cardiovascular:     Rate and Rhythm: Normal rate and regular rhythm.     Heart sounds: No murmur heard. Pulmonary:     Effort: Pulmonary effort is normal. No respiratory distress.     Breath sounds: Normal breath sounds.  Musculoskeletal:        General: Normal range of motion.     Cervical back: Normal range of motion and neck supple.     Right lower leg: No edema.     Left lower leg: No edema.  Skin:    General: Skin is warm and dry.  Neurological:     Mental Status: She is alert and oriented to person, place, and time.  Psychiatric:        Mood and Affect: Mood normal.        Behavior: Behavior  normal.        Thought Content: Thought content normal.        Judgment: Judgment normal.     BP 100/80 (BP Location: Left Arm, Patient Position: Sitting, Cuff Size: Large)   Pulse 64   Temp 98.3 F (36.8 C) (Oral)   Resp 16   Ht 5' 7 (1.702 m)   Wt 218 lb 12.8 oz (99.2 kg)   SpO2 95%   BMI 34.27 kg/m  Wt Readings from Last 3 Encounters:  05/05/24 218 lb 12.8 oz (99.2 kg)  06/08/23 222 lb 3.2 oz (100.8 kg)  02/12/23 226 lb (102.5 kg)    Diabetic Foot Exam - Simple   No data filed    Lab Results  Component Value Date   WBC 6.8 06/08/2023   HGB 12.3 06/08/2023   HCT 39.4 06/08/2023   PLT 352.0 06/08/2023   GLUCOSE 98 06/08/2023   CHOL 221 (H) 06/08/2023   TRIG 117.0 06/08/2023   HDL 58.10 06/08/2023   LDLDIRECT 132.0 05/12/2022   LDLCALC 139 (H) 06/08/2023   ALT 26 06/08/2023   AST 25 06/08/2023   NA 141 06/08/2023   K 3.9 06/08/2023   CL 100 06/08/2023   CREATININE 0.73 06/08/2023   BUN 16 06/08/2023   CO2 30 06/08/2023   TSH 2.00 06/08/2023   HGBA1C 6.2 10/30/2022    Lab Results  Component Value Date   TSH 2.00 06/08/2023   Lab Results  Component Value Date   WBC 6.8 06/08/2023   HGB 12.3 06/08/2023   HCT 39.4 06/08/2023   MCV 79.7 06/08/2023   PLT 352.0 06/08/2023   Lab Results  Component Value Date   NA 141 06/08/2023   K 3.9 06/08/2023   CO2 30 06/08/2023   GLUCOSE 98 06/08/2023   BUN 16 06/08/2023  CREATININE 0.73 06/08/2023   BILITOT 0.6 06/08/2023   ALKPHOS 64 06/08/2023   AST 25 06/08/2023   ALT 26 06/08/2023   PROT 7.2 06/08/2023   ALBUMIN 4.8 06/08/2023   CALCIUM  9.7 06/08/2023   ANIONGAP 9 02/17/2022   GFR 94.04 06/08/2023   Lab Results  Component Value Date   CHOL 221 (H) 06/08/2023   Lab Results  Component Value Date   HDL 58.10 06/08/2023   Lab Results  Component Value Date   LDLCALC 139 (H) 06/08/2023   Lab Results  Component Value Date   TRIG 117.0 06/08/2023   Lab Results  Component Value Date    CHOLHDL 4 06/08/2023   Lab Results  Component Value Date   HGBA1C 6.2 10/30/2022       Assessment & Plan:  Hip pain, acute, left -     Celecoxib ; Take 1 capsule (200 mg total) by mouth daily.  Dispense: 90 capsule; Refill: 3  Essential hypertension Assessment & Plan: Well controlled, no changes to meds. Encouraged heart healthy diet such as the DASH diet and exercise as tolerated.    Orders: -     CBC with Differential/Platelet  Prediabetes -     Hemoglobin A1c -     Insulin, random  Morbid obesity (HCC) Assessment & Plan: Pt will check with ins co about weight loss meds   Hyperlipidemia, unspecified hyperlipidemia type Assessment & Plan: Encourage heart healthy diet such as MIND or DASH diet, increase exercise, avoid trans fats, simple carbohydrates and processed foods, consider a krill or fish or flaxseed oil cap daily.    Orders: -     Comprehensive metabolic panel with GFR -     Lipid panel  Urge incontinence of urine Assessment & Plan: Check urine  Check labs   Orders: -     POCT Urinalysis Dipstick (Automated)  Chronic left shoulder pain Assessment & Plan: Refer to ortho --- ? Tendonitis  Rest , ice---  splint as needed   Orders: -     Ambulatory referral to Orthopedic Surgery  Chronic heel pain, right Assessment & Plan: Refer to ortho   Orders: -     Ambulatory referral to Orthopedic Surgery  Chronic pain of left thumb Assessment & Plan: Refer to ortho --- ? Tendonitis  Rest , ice---  splint as needed   Orders: -     Ambulatory referral to Orthopedic Surgery  Colon cancer screening -     Ambulatory referral to Gastroenterology  Family history of colon cancer in father -     Ambulatory referral to Gastroenterology  Primary hypertension Assessment & Plan: Well controlled, no changes to meds. Encouraged heart healthy diet such as the DASH diet and exercise as tolerated.     Assessment and Plan Assessment & Plan Pain in left hip    Chronic pain in the left hip is effectively managed with Celebrex . Refilled Celebrex  for continued management.  Pain in left shoulder   Severe pain in the left shoulder, worsened by movement, possibly from repetitive strain at work. Referred to [facility] for evaluation.  Pain in right foot   Intermittent pain in the right foot with a noticeable bump, possibly a spur, causing tenderness and discomfort after work. Referred to Dr. Kit for evaluation.  Pain in left thumb   Chronic pain in the left thumb with difficulty gripping and dropping objects, possibly due to arthritis or tendon issues. Referred to Dr. Kay for evaluation.  Urge incontinence   Experiencing  urgency and urge incontinence, particularly at work.  Essential hypertension   Hypertension is managed with losartan .  Prediabetes   Discussed potential future coverage for semaglutide  for prediabetes management, pending insurance updates. Will discuss with insurance about potential coverage.  Morbid obesity   Discussed semaglutide  for weight management, with current cost barriers due to insurance coverage issues. Will discuss with insurance about potential coverage.  Hyperlipidemia   No specific discussion about hyperlipidemia management in this encounter.  General Health Maintenance   Discussed colonoscopy due to family history of colon cancer and previous scheduling issues. Requested colonoscopy scheduling.    Kari Everding R Lowne Chase, DO

## 2024-05-05 NOTE — Assessment & Plan Note (Signed)
 Check urine  Check labs

## 2024-05-06 ENCOUNTER — Other Ambulatory Visit: Payer: Self-pay | Admitting: Family Medicine

## 2024-05-06 DIAGNOSIS — I10 Essential (primary) hypertension: Secondary | ICD-10-CM

## 2024-05-06 LAB — INSULIN, RANDOM: Insulin: 5.9 u[IU]/mL

## 2024-05-17 ENCOUNTER — Ambulatory Visit: Payer: Self-pay | Admitting: Family Medicine

## 2024-05-25 ENCOUNTER — Other Ambulatory Visit: Payer: Self-pay | Admitting: Family Medicine

## 2024-05-25 DIAGNOSIS — F320 Major depressive disorder, single episode, mild: Secondary | ICD-10-CM

## 2024-05-25 DIAGNOSIS — R232 Flushing: Secondary | ICD-10-CM

## 2024-07-28 ENCOUNTER — Other Ambulatory Visit: Payer: Self-pay | Admitting: Family Medicine

## 2024-08-03 ENCOUNTER — Other Ambulatory Visit: Payer: Self-pay | Admitting: Family Medicine

## 2024-08-03 DIAGNOSIS — I10 Essential (primary) hypertension: Secondary | ICD-10-CM
